# Patient Record
Sex: Female | Born: 1995 | Race: White | Hispanic: No | Smoking: Never smoker
Health system: Southern US, Community
[De-identification: ages and names within clinical notes are randomized; demographics above are authoritative.]

## PROBLEM LIST (undated history)

## (undated) DIAGNOSIS — F419 Anxiety disorder, unspecified: Secondary | ICD-10-CM

## (undated) DIAGNOSIS — G43909 Migraine, unspecified, not intractable, without status migrainosus: Secondary | ICD-10-CM

## (undated) DIAGNOSIS — S82102A Unspecified fracture of upper end of left tibia, initial encounter for closed fracture: Secondary | ICD-10-CM

## (undated) DIAGNOSIS — F99 Mental disorder, not otherwise specified: Secondary | ICD-10-CM

## (undated) DIAGNOSIS — G4700 Insomnia, unspecified: Secondary | ICD-10-CM

## (undated) HISTORY — PX: DENTAL SURGERY: SHX609

## (undated) HISTORY — DX: Mental disorder, not otherwise specified: F99

## (undated) HISTORY — PX: TONSILLECTOMY: SUR1361

---
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
## 2016-01-22 ENCOUNTER — Encounter (HOSPITAL_COMMUNITY): Payer: Self-pay

## 2016-01-22 ENCOUNTER — Emergency Department (HOSPITAL_COMMUNITY)
Admission: EM | Admit: 2016-01-22 | Discharge: 2016-01-23 | Disposition: A | Discharge status: Home / Self Care | Payer: BLUE CROSS/BLUE SHIELD | Attending: Emergency Medicine | Admitting: Emergency Medicine

## 2016-01-22 DIAGNOSIS — N390 Urinary tract infection, site not specified: Secondary | ICD-10-CM | POA: Insufficient documentation

## 2016-01-22 DIAGNOSIS — K920 Hematemesis: Secondary | ICD-10-CM | POA: Diagnosis present

## 2016-01-22 DIAGNOSIS — R109 Unspecified abdominal pain: Secondary | ICD-10-CM

## 2016-01-22 HISTORY — DX: Migraine, unspecified, not intractable, without status migrainosus: G43.909

## 2016-01-22 HISTORY — DX: Anxiety disorder, unspecified: F41.9

## 2016-01-22 HISTORY — DX: Insomnia, unspecified: G47.00

## 2016-01-22 LAB — I-STAT BETA HCG BLOOD, ED (MC, WL, AP ONLY)

## 2016-01-22 LAB — COMPREHENSIVE METABOLIC PANEL
ALK PHOS: 93 U/L (ref 38–126)
ALT: 25 U/L (ref 14–54)
ANION GAP: 10 (ref 5–15)
AST: 28 U/L (ref 15–41)
Albumin: 4.2 g/dL (ref 3.5–5.0)
BILIRUBIN TOTAL: 0.3 mg/dL (ref 0.3–1.2)
BUN: 8 mg/dL (ref 6–20)
CALCIUM: 9.4 mg/dL (ref 8.9–10.3)
CO2: 22 mmol/L (ref 22–32)
Chloride: 101 mmol/L (ref 101–111)
Creatinine, Ser: 0.73 mg/dL (ref 0.44–1.00)
GFR calc Af Amer: >60 mL/min (ref >60)
Glucose, Bld: 87 mg/dL (ref 65–99)
POTASSIUM: 3.2 mmol/L — AB (ref 3.5–5.1)
Sodium: 133 mmol/L — ABNORMAL LOW (ref 135–145)
TOTAL PROTEIN: 8.2 g/dL — AB (ref 6.5–8.1)

## 2016-01-22 LAB — CBC
HEMATOCRIT: 44.2 % (ref 36.0–46.0)
Hemoglobin: 15 g/dL (ref 12.0–15.0)
MCH: 29.9 pg (ref 26.0–34.0)
MCHC: 33.9 g/dL (ref 30.0–36.0)
MCV: 88 fL (ref 78.0–100.0)
Platelets: 325 10*3/uL (ref 150–400)
RBC: 5.02 MIL/uL (ref 3.87–5.11)
RDW: 12.5 % (ref 11.5–15.5)
WBC: 8.1 10*3/uL (ref 4.0–10.5)

## 2016-01-22 LAB — URINE MICROSCOPIC-ADD ON

## 2016-01-22 LAB — URINALYSIS, ROUTINE W REFLEX MICROSCOPIC
BILIRUBIN URINE: NEGATIVE
Glucose, UA: NEGATIVE mg/dL
Ketones, ur: NEGATIVE mg/dL
NITRITE: POSITIVE — AB
PH: 5.5 (ref 5.0–8.0)
Protein, ur: NEGATIVE mg/dL
Specific Gravity, Urine: 1.029 (ref 1.005–1.030)

## 2016-01-22 LAB — LIPASE, BLOOD: Lipase: 21 U/L (ref 11–51)

## 2016-01-22 NOTE — ED Triage Notes (Signed)
Onset 5pm vomiting x 5.  Last few vomits has had dark red blood mixed in vomitus.  Onset last night diarrhea x < 10, brick color now.  No one in household with same symptoms.  C/o nasal congestion.

## 2016-01-23 LAB — POC OCCULT BLOOD, ED: Fecal Occult Bld: NEGATIVE

## 2016-01-23 MED ORDER — CEPHALEXIN 500 MG PO CAPS: 500.0000 mg | ORAL_CAPSULE | Freq: Three times a day (TID) | ORAL | 0 refills | Status: DC

## 2016-01-23 MED ORDER — PANTOPRAZOLE SODIUM 20 MG PO TBEC: 20.0000 mg | DELAYED_RELEASE_TABLET | Freq: Every day | ORAL | 0 refills | Status: DC

## 2016-01-23 MED ORDER — DEXTROSE 5 % IV SOLN
1.0000 g | Freq: Once | INTRAVENOUS | Status: AC
  Administered 2016-01-23: 1 g via INTRAVENOUS
  Filled 2016-01-23: qty 10

## 2016-01-23 MED ORDER — SODIUM CHLORIDE 0.9 % IV BOLUS (SEPSIS)
1000.0000 mL | Freq: Once | INTRAVENOUS | Status: AC
  Administered 2016-01-23: 1000 mL via INTRAVENOUS

## 2016-01-23 MED ORDER — FAMOTIDINE IN NACL 20-0.9 MG/50ML-% IV SOLN
20.0000 mg | Freq: Once | INTRAVENOUS | Status: AC
  Administered 2016-01-23: 20 mg via INTRAVENOUS
  Filled 2016-01-23: qty 50

## 2016-01-23 MED ORDER — ONDANSETRON HCL 4 MG PO TABS: 4.0000 mg | ORAL_TABLET | Freq: Three times a day (TID) | ORAL | 0 refills | Status: DC | PRN

## 2016-01-23 NOTE — Discharge Instructions (Signed)
Read the information below.  Use the prescribed medication as directed.  Please discuss all new medications with your pharmacist.  You may return to the Emergency Department at any time for worsening condition or any new symptoms that concern you.   If you develop high fevers, worsening abdominal pain, uncontrolled vomiting, or are unable to tolerate fluids by mouth, return to the ER for a recheck.  ° °

## 2016-01-23 NOTE — ED Provider Notes (Signed)
[facility]-EMERGENCY DEPT Provider Note   CSN: [id number] Arrival date & time: 01/22/16  2217     History   Chief Complaint No chief complaint on file.   HPI Vanessa Nelson is a 20 y.o. female.  HPI   Pt with hx frequent UTIs p/w abdominal pain, vomiting with hematemesis, brick-colored stool with mucus, malodorous dark urine.  The symptoms began 3 days ago with crampy pain in the periumbilical area, subjective fevers.  Today she developed vomiting that initially had specks of blood in it and as she vomited more and more she developed more blood.  She also noticed that her stool today was brick red in color with some mucus floating with it.  Denies abnormal vaginal discharge or bleeding.  Denies any other abnormal bleeding.   She has never had a kidney stone.  Never had any GI-related disease including PUD, colitis.  Denies use of NSAIDs, aspirin, ETOH.    Past Medical History:  Diagnosis Date  . Anxiety   . Fracture of upper end of left tibia 07/06/2014   fell while skateboarding  . Insomnia   . Migraines     There are no active problems to display for this patient.   Past Surgical History:  Procedure Laterality Date  . CHONDROPLASTY Left 07/09/2014   Procedure: CHONDROPLASTY;  Surgeon: Sheral Apleyimothy D Murphy, MD;  Location: [facility];  Service: Orthopedics;  Laterality: Left;  . DENTAL SURGERY     for impacted teeth  . KNEE ARTHROSCOPY Left 07/09/2014   Procedure: ARTHROSCOPY KNEE;  Surgeon: Sheral Apleyimothy D Murphy, MD;  Location: [facility];  Service: Orthopedics;  Laterality: Left;  . ORIF TIBIA PLATEAU Left 07/09/2014   Procedure: OPEN REDUCTION INTERNAL FIXATION (ORIF) TIBIAL PLATEAU;  Surgeon: Sheral Apleyimothy D Murphy, MD;  Location:  SURGERY CENTER;  Service: Orthopedics;  Laterality: Left;    OB History    No data available       Home Medications    Prior to Admission medications   Medication Sig Start Date End Date Taking? Authorizing Provider    cephALEXin (KEFLEX) 500 MG capsule Take 1 capsule (500 mg total) by mouth 3 (three) times daily. 01/23/16   Trixie DredgeEmily Laiya Wisby, PA-C  docusate sodium (COLACE) 100 MG capsule Take 1 capsule (100 mg total) by mouth 2 (two) times daily. Continue this while taking narcotics to help with bowel movements 07/09/14   Sheral Apleyimothy D Murphy, MD  HYDROcodone-acetaminophen [facility](NORCO) 5-325 MG per tablet Take 1-2 tablets by mouth every 6 (six) hours as needed for moderate pain. MAXIMUM TOTAL ACETAMINOPHEN DOSE IS 4000 MG PER DAY 07/09/14   Teryl LucyJoshua Landau, MD  ondansetron (ZOFRAN) 4 MG tablet Take 1 tablet (4 mg total) by mouth every 8 (eight) hours as needed for nausea or vomiting. 01/23/16   Trixie DredgeEmily Tywana Robotham, PA-C  oxyCODONE-acetaminophen (ROXICET) 5-325 MG per tablet Take 2 tablets by mouth every 4 (four) hours as needed. 07/09/14   Sheral Apleyimothy D Murphy, MD  pantoprazole (PROTONIX) 20 MG tablet Take 1 tablet (20 mg total) by mouth daily. 01/23/16   Trixie DredgeEmily Reign Dziuba, PA-C    Family History History reviewed. No pertinent family history.  Social History Social History  Substance Use Topics  . Smoking status: Never Smoker  . Smokeless tobacco: Never Used  . Alcohol use No     Allergies   Oxycodone; Tamiflu [oseltamivir phosphate]; and Nickel   Review of Systems Review of Systems  All other systems reviewed and are negative.    Physical Exam  Updated Vital Signs BP 119/69   Pulse 83   Temp 97.6 F (36.4 C) (Oral)   Resp 14   Ht 5\' 1"  (1.549 m)   Wt 73.9 kg   LMP 01/08/2016   SpO2 100%   BMI 30.80 kg/m   Physical Exam  Constitutional: She appears well-developed and well-nourished. No distress.  HENT:  Head: Normocephalic and atraumatic.  Neck: Neck supple.  Cardiovascular: Normal rate and regular rhythm.   Pulmonary/Chest: Effort normal and breath sounds normal. No respiratory distress. She has no wheezes. She has no rales.  Abdominal: Soft. She exhibits no distension. There is tenderness in the epigastric area and  suprapubic area. There is CVA tenderness (mild, right ). There is no rebound and no guarding.  Neurological: She is alert.  Skin: She is not diaphoretic.  Nursing note and vitals reviewed.    ED Treatments / Results  Labs (all labs ordered are listed, but only abnormal results are displayed) Labs Reviewed  COMPREHENSIVE METABOLIC PANEL - Abnormal; Notable for the following:       Result Value   Sodium 133 (*)    Potassium 3.2 (*)    Total Protein 8.2 (*)    All other components within normal limits  URINALYSIS, ROUTINE W REFLEX MICROSCOPIC (NOT AT [facility]) - Abnormal; Notable for the following:    APPearance TURBID (*)    Hgb urine dipstick TRACE (*)    Nitrite POSITIVE (*)    Leukocytes, UA SMALL (*)    All other components within normal limits  URINE MICROSCOPIC-ADD ON - Abnormal; Notable for the following:    Squamous Epithelial / LPF 0-5 (*)    Bacteria, UA MANY (*)    Crystals CA OXALATE CRYSTALS (*)    All other components within normal limits  URINE CULTURE  LIPASE, BLOOD  CBC  I-STAT BETA HCG BLOOD, ED ([facility], [facility], [facility] ONLY)  POC OCCULT BLOOD, ED    EKG  EKG Interpretation None       Radiology No results found.  Procedures Procedures (including critical care time)  Medications Ordered in ED Medications  sodium chloride 0.9 % bolus 1,000 mL (0 mLs Intravenous Stopped 01/23/16 0150)  cefTRIAXone (ROCEPHIN) 1 g in dextrose 5 % 50 mL IVPB (0 g Intravenous Stopped 01/23/16 0225)  famotidine (PEPCID) IVPB 20 mg premix (0 mg Intravenous Stopped 01/23/16 0150)     Initial Impression / Assessment and Plan / ED Course  I have reviewed the triage vital signs and the nursing notes.  Pertinent labs & imaging results that were available during my care of the patient were reviewed by me and considered in my medical decision making (see chart for details).  Clinical Course  Comment By Time  Pt not yet in room  [facility], PA-C 08/28 0000   Afebrile, nontoxic patient  with abdominal pain, vomiting, malodorous urine.  Workup remarkable for urine that appears infected.  Hgb is normal.  Hemoccult negative.  Pt given IVF, IV pepcid, IV rocephin.  Urine sent for culture.  Pt monitored in ED with no further vomiting.   D/C home with keflex (extended for possible early pyelo), protonix, zofran.  PCP follow up.  Discussed strict return precautions.  Discussed result, findings, treatment, and follow up  with patient.  Pt given return precautions.  Pt verbalizes understanding and agrees with plan.       Final Clinical Impressions(s) / ED Diagnoses   Final diagnoses:  Abdominal pain, unspecified abdominal location  UTI (lower  urinary tract infection)    New Prescriptions New Prescriptions   CEPHALEXIN (KEFLEX) 500 MG CAPSULE    Take 1 capsule (500 mg total) by mouth 3 (three) times daily.   ONDANSETRON (ZOFRAN) 4 MG TABLET    Take 1 tablet (4 mg total) by mouth every 8 (eight) hours as needed for nausea or vomiting.   PANTOPRAZOLE (PROTONIX) 20 MG TABLET    Take 1 tablet (20 mg total) by mouth daily.     Trixie Dredge, PA-C 01/23/16 0236    Linwood Dibbles, MD 01/24/16 [phone]

## 2016-01-25 LAB — URINE CULTURE: Culture: >=100000 — AB

## 2016-01-26 ENCOUNTER — Telehealth (HOSPITAL_COMMUNITY): Payer: Self-pay

## 2016-01-26 NOTE — Telephone Encounter (Signed)
Post ED Visit - Positive Culture Follow-up  Culture report reviewed by antimicrobial stewardship pharmacist:  []  Enzo BiNathan Batchelder, Pharm.D. []  Celedonio MiyamotoJeremy Frens, Pharm.D., BCPS []  Garvin FilaMike Maccia, Pharm.D. []  Georgina PillionElizabeth Martin, Pharm.D., BCPS []  LakelandMinh Pham, [address] Rainbow BoulevardPharm.D., BCPS, AAHIVP []  Estella HuskMichelle Turner, Pharm.D., BCPS, AAHIVP []  Tennis Mustassie Stewart, Pharm.D. []  Sherle Poeob Vincent, [place].Morrie Sheldon. X  Michael Bitonti, Pharm.D.  Positive urine culture>/= 100,000 colonies -> E Coli Treated with Cephalexin, organism sensitive to the same and no further patient follow-up is required at this time.  Arvid RightClark, [place] Bensinger Dorn 01/26/2016, 12:26 PM

## 2016-09-26 ENCOUNTER — Encounter (HOSPITAL_COMMUNITY): Payer: Self-pay | Admitting: *Deleted

## 2016-09-26 ENCOUNTER — Emergency Department (HOSPITAL_COMMUNITY)
Admission: EM | Admit: 2016-09-26 | Discharge: 2016-09-26 | Disposition: A | Discharge status: Other Acute Inpatient Hospital | Payer: BLUE CROSS/BLUE SHIELD | Attending: Emergency Medicine | Admitting: Emergency Medicine

## 2016-09-26 ENCOUNTER — Observation Stay (HOSPITAL_COMMUNITY)
Admission: AD | Admit: 2016-09-26 | Discharge: 2016-09-27 | Disposition: A | Discharge status: Home / Self Care | Payer: BLUE CROSS/BLUE SHIELD | Source: Intra-hospital | Attending: Psychiatry | Admitting: Psychiatry

## 2016-09-26 DIAGNOSIS — F332 Major depressive disorder, recurrent severe without psychotic features: Secondary | ICD-10-CM | POA: Insufficient documentation

## 2016-09-26 DIAGNOSIS — Z79899 Other long term (current) drug therapy: Secondary | ICD-10-CM | POA: Insufficient documentation

## 2016-09-26 DIAGNOSIS — F329 Major depressive disorder, single episode, unspecified: Secondary | ICD-10-CM | POA: Diagnosis present

## 2016-09-26 DIAGNOSIS — Z793 Long term (current) use of hormonal contraceptives: Secondary | ICD-10-CM | POA: Diagnosis not present

## 2016-09-26 DIAGNOSIS — R45851 Suicidal ideations: Secondary | ICD-10-CM | POA: Insufficient documentation

## 2016-09-26 DIAGNOSIS — F1290 Cannabis use, unspecified, uncomplicated: Secondary | ICD-10-CM | POA: Diagnosis not present

## 2016-09-26 DIAGNOSIS — F339 Major depressive disorder, recurrent, unspecified: Secondary | ICD-10-CM

## 2016-09-26 DIAGNOSIS — F419 Anxiety disorder, unspecified: Secondary | ICD-10-CM | POA: Insufficient documentation

## 2016-09-26 LAB — CBC WITH DIFFERENTIAL/PLATELET
Basophils Absolute: 0 10*3/uL (ref 0.0–0.1)
Basophils Relative: 0 %
EOS ABS: 0 10*3/uL (ref 0.0–0.7)
Eosinophils Relative: 0 %
HCT: 43 % (ref 36.0–46.0)
Hemoglobin: 14.6 g/dL (ref 12.0–15.0)
Lymphocytes Relative: 20 %
Lymphs Abs: 1.6 10*3/uL (ref 0.7–4.0)
MCH: 30.2 pg (ref 26.0–34.0)
MCHC: 34 g/dL (ref 30.0–36.0)
MCV: 89 fL (ref 78.0–100.0)
MONO ABS: 0.4 10*3/uL (ref 0.1–1.0)
MONOS PCT: 5 %
Neutro Abs: 6.1 10*3/uL (ref 1.7–7.7)
Neutrophils Relative %: 75 %
Platelets: 311 10*3/uL (ref 150–400)
RBC: 4.83 MIL/uL (ref 3.87–5.11)
RDW: 12.4 % (ref 11.5–15.5)
WBC: 8 10*3/uL (ref 4.0–10.5)

## 2016-09-26 LAB — COMPREHENSIVE METABOLIC PANEL
ALBUMIN: 4.5 g/dL (ref 3.5–5.0)
ALK PHOS: 80 U/L (ref 38–126)
ALT: 17 U/L (ref 14–54)
ANION GAP: 8 (ref 5–15)
AST: 20 U/L (ref 15–41)
BUN: 10 mg/dL (ref 6–20)
CALCIUM: 9.3 mg/dL (ref 8.9–10.3)
CO2: 24 mmol/L (ref 22–32)
Chloride: 105 mmol/L (ref 101–111)
Creatinine, Ser: 0.76 mg/dL (ref 0.44–1.00)
GFR calc non Af Amer: >60 mL/min (ref >60)
GLUCOSE: 89 mg/dL (ref 65–99)
POTASSIUM: 4.2 mmol/L (ref 3.5–5.1)
SODIUM: 137 mmol/L (ref 135–145)
Total Bilirubin: 0.6 mg/dL (ref 0.3–1.2)
Total Protein: 8.3 g/dL — ABNORMAL HIGH (ref 6.5–8.1)

## 2016-09-26 LAB — I-STAT BETA HCG BLOOD, ED (MC, WL, AP ONLY): I-stat hCG, quantitative: <5 m[IU]/mL (ref <5)

## 2016-09-26 LAB — RAPID URINE DRUG SCREEN, HOSP PERFORMED
Amphetamines: NOT DETECTED
BENZODIAZEPINES: NOT DETECTED
Barbiturates: NOT DETECTED
Cocaine: NOT DETECTED
OPIATES: NOT DETECTED
Tetrahydrocannabinol: POSITIVE — AB

## 2016-09-26 LAB — ACETAMINOPHEN LEVEL: Acetaminophen (Tylenol), Serum: <10 ug/mL — ABNORMAL LOW (ref 10–30)

## 2016-09-26 LAB — ETHANOL: Alcohol, Ethyl (B): <5 mg/dL (ref <5)

## 2016-09-26 LAB — SALICYLATE LEVEL

## 2016-09-26 MED ORDER — TRAZODONE HCL 50 MG PO TABS
50.0000 mg | ORAL_TABLET | Freq: Every evening | ORAL | Status: DC | PRN
  Administered 2016-09-26: 50 mg via ORAL
  Filled 2016-09-26: qty 1

## 2016-09-26 MED ORDER — IBUPROFEN 200 MG PO TABS
600.0000 mg | ORAL_TABLET | Freq: Three times a day (TID) | ORAL | Status: DC | PRN
  Administered 2016-09-26: 600 mg via ORAL
  Filled 2016-09-26: qty 3

## 2016-09-26 MED ORDER — ONDANSETRON HCL 4 MG PO TABS: 4.0000 mg | ORAL_TABLET | Freq: Three times a day (TID) | ORAL | Status: DC | PRN

## 2016-09-26 MED ORDER — NORETHIN ACE-ETH ESTRAD-FE 1-20 MG-MCG PO TABS: 1.0000 | ORAL_TABLET | Freq: Every day | ORAL | Status: DC

## 2016-09-26 MED ORDER — ACETAMINOPHEN 325 MG PO TABS
650.0000 mg | ORAL_TABLET | ORAL | Status: DC | PRN
Start: 2016-09-26 — End: 2016-09-26
  Administered 2016-09-26: 650 mg via ORAL
  Filled 2016-09-26: qty 2

## 2016-09-26 MED ORDER — HYDROXYZINE HCL 25 MG PO TABS: 25.0000 mg | ORAL_TABLET | Freq: Four times a day (QID) | ORAL | Status: DC | PRN

## 2016-09-26 MED ORDER — ALUM & MAG HYDROXIDE-SIMETH 200-200-20 MG/5ML PO SUSP: 30.0000 mL | ORAL | Status: DC | PRN

## 2016-09-26 MED ORDER — ONDANSETRON HCL 4 MG PO TABS
4.0000 mg | ORAL_TABLET | Freq: Three times a day (TID) | ORAL | Status: DC | PRN
  Administered 2016-09-26: 4 mg via ORAL
  Filled 2016-09-26: qty 1

## 2016-09-26 MED ORDER — LAMOTRIGINE 25 MG PO TABS
75.0000 mg | ORAL_TABLET | Freq: Every day | ORAL | Status: DC
  Administered 2016-09-26: 75 mg via ORAL
  Filled 2016-09-26: qty 3

## 2016-09-26 MED ORDER — ACETAMINOPHEN 325 MG PO TABS: 650.0000 mg | ORAL_TABLET | ORAL | Status: DC | PRN

## 2016-09-26 MED ORDER — HOME MED STORE IN PYXIS: 1.0000 | Freq: Every day | Status: DC

## 2016-09-26 MED ORDER — IBUPROFEN 600 MG PO TABS: 600.0000 mg | ORAL_TABLET | Freq: Three times a day (TID) | ORAL | Status: DC | PRN

## 2016-09-26 MED ORDER — LORAZEPAM 1 MG PO TABS
1.0000 mg | ORAL_TABLET | Freq: Three times a day (TID) | ORAL | Status: DC | PRN
  Administered 2016-09-26: 1 mg via ORAL
  Filled 2016-09-26: qty 1

## 2016-09-26 MED ORDER — LAMOTRIGINE 25 MG PO TABS: 75.0000 mg | ORAL_TABLET | Freq: Every day | ORAL | Status: DC

## 2016-09-26 MED ORDER — ZOLPIDEM TARTRATE 5 MG PO TABS: 5.0000 mg | ORAL_TABLET | Freq: Every evening | ORAL | Status: DC | PRN
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
## 2016-09-26 NOTE — ED Triage Notes (Signed)
Pt reports suicidal thought and reports her meds for bipolar disorder are not working. Denies plan for si nor Hi . Pt in tears and sts she needs help . Also reports HX anxiety attacks and no meds. Alert and oriented x 4.

## 2016-09-26 NOTE — ED Notes (Signed)
Bed: WA31 Expected date:  Expected time:  Means of arrival:  Comments: 

## 2016-09-26 NOTE — ED Provider Notes (Signed)
[facility]-EMERGENCY DEPT Provider Note   CSN: [id number] Arrival date & time: 09/26/16  1250     History   Chief Complaint No chief complaint on file.   HPI Vanessa Nelson is a 21 y.o. female with a PMHx of migraines, bipolar disorder, anxiety, and chronic pain, who presents to the ED with complaints of worsening depression over the last several months and suicidal ideations 1 day. Patient states that she was started on a course of antidepressants in August 2016 that was "all wrong", she then switched psychiatrists and is now seeing Dr. Catha Gosselin at [facility] in [place], and he started her on Lamictal. She was initially taking 50 mg every night, but in March he increased her dose to 75 mg a night. She takes this as directed, is compliant last taking it at 11 PM last night. She feels that this isn't helping, and last night she began having suicidal ideations without a specific plan. She denies HI/AVH, illicit drug use, alcohol use, or smoking tobacco. She denies any other medical complaints at this time. She is here voluntarily. Her mother is in the lobby all of her belongings. Of note, the only other medication she takes is birth control, which her mother brought with her.   The history is provided by the patient and medical records. No language interpreter was used.  Mental Health Problem  Presenting symptoms: depression and suicidal thoughts   Presenting symptoms: no hallucinations and no homicidal ideas   Patient accompanied by:  Parent Onset quality:  Gradual Duration:  1 day (SI x1 day, but depression worsening for months) Timing:  Constant Progression:  Worsening Chronicity:  Chronic Context: recent medication change   Context: not alcohol use and not drug abuse   Treatment compliance:  All of the time Time since last psychoactive medication taken:  13 hours Relieved by:  None tried Worsened by:  Nothing Ineffective treatments:  None tried Associated symptoms: no abdominal pain  and no chest pain   Risk factors: hx of mental illness     Past Medical History:  Diagnosis Date  . Anxiety   . Fracture of upper end of left tibia 07/06/2014   fell while skateboarding  . Insomnia   . Migraines     There are no active problems to display for this patient.   Past Surgical History:  Procedure Laterality Date  . CHONDROPLASTY Left 07/09/2014   Procedure: CHONDROPLASTY;  Surgeon: Sheral Apley, MD;  Location: [facility];  Service: Orthopedics;  Laterality: Left;  . DENTAL SURGERY     for impacted teeth  . KNEE ARTHROSCOPY Left 07/09/2014   Procedure: ARTHROSCOPY KNEE;  Surgeon: Sheral Apley, MD;  Location: [facility];  Service: Orthopedics;  Laterality: Left;  . ORIF TIBIA PLATEAU Left 07/09/2014   Procedure: OPEN REDUCTION INTERNAL FIXATION (ORIF) TIBIAL PLATEAU;  Surgeon: Sheral Apley, MD;  Location:  SURGERY CENTER;  Service: Orthopedics;  Laterality: Left;    OB History    No data available       Home Medications    Prior to Admission medications   Medication Sig Start Date End Date Taking? Authorizing Provider  cephALEXin (KEFLEX) 500 MG capsule Take 1 capsule (500 mg total) by mouth 3 (three) times daily. 01/23/16   Trixie Dredge, PA-C  docusate sodium (COLACE) 100 MG capsule Take 1 capsule (100 mg total) by mouth 2 (two) times daily. Continue this while taking narcotics to help with bowel movements 07/09/14  Sheral Apley, MD  HYDROcodone-acetaminophen (NORCO) 5-325 MG per tablet Take 1-2 tablets by mouth every 6 (six) hours as needed for moderate pain. MAXIMUM TOTAL ACETAMINOPHEN DOSE IS 4000 MG PER DAY 07/09/14   Teryl Lucy, MD  ondansetron (ZOFRAN) 4 MG tablet Take 1 tablet (4 mg total) by mouth every 8 (eight) hours as needed for nausea or vomiting. 01/23/16   Trixie Dredge, PA-C  oxyCODONE-acetaminophen (ROXICET) 5-325 MG per tablet Take 2 tablets by mouth every 4 (four) hours as needed. 07/09/14   Sheral Apley, MD  pantoprazole (PROTONIX) 20 MG tablet Take 1 tablet (20 mg total) by mouth daily. 01/23/16   Trixie Dredge, PA-C    Family History No family history on file.  Social History Social History  Substance Use Topics  . Smoking status: Never Smoker  . Smokeless tobacco: Never Used  . Alcohol use No     Allergies   Oxycodone; Tamiflu [oseltamivir phosphate]; and Nickel   Review of Systems Review of Systems  Constitutional: Negative for chills and fever.  Respiratory: Negative for shortness of breath.   Cardiovascular: Negative for chest pain.  Gastrointestinal: Negative for abdominal pain, constipation, diarrhea, nausea and vomiting.  Genitourinary: Negative for dysuria and hematuria.  Musculoskeletal: Negative for arthralgias and myalgias.  Skin: Negative for color change.  Allergic/Immunologic: Negative for immunocompromised state.  Neurological: Negative for weakness and numbness.  Psychiatric/Behavioral: Positive for suicidal ideas. Negative for confusion, hallucinations and homicidal ideas.   All other systems reviewed and are negative for acute change except as noted in the HPI.    Physical Exam Updated Vital Signs BP 129/78 (BP Location: Left Arm)   Pulse 80   Temp 98.1 F (36.7 C) (Oral)   Resp 16   SpO2 100%   Physical Exam  Constitutional: She is oriented to person, place, and time. Vital signs are normal. She appears well-developed and well-nourished.  Non-toxic appearance. No distress.  Afebrile, nontoxic, NAD, tearful but pleasant and cooperative  HENT:  Head: Normocephalic and atraumatic.  Mouth/Throat: Oropharynx is clear and moist and mucous membranes are normal.  Eyes: Conjunctivae and EOM are normal. Right eye exhibits no discharge. Left eye exhibits no discharge.  Neck: Normal range of motion. Neck supple.  Cardiovascular: Normal rate, regular rhythm, normal heart sounds and intact distal pulses.  Exam reveals no gallop and no friction rub.   No  murmur heard. Pulmonary/Chest: Effort normal and breath sounds normal. No respiratory distress. She has no decreased breath sounds. She has no wheezes. She has no rhonchi. She has no rales.  Abdominal: Soft. Normal appearance and bowel sounds are normal. She exhibits no distension. There is no tenderness. There is no rigidity, no rebound, no guarding, no CVA tenderness, no tenderness at McBurney's point and negative Murphy's sign.  Musculoskeletal: Normal range of motion.  Neurological: She is alert and oriented to person, place, and time. She has normal strength. No sensory deficit.  Skin: Skin is warm, dry and intact. No rash noted.  Psychiatric: She is not actively hallucinating. She exhibits a depressed mood. She expresses suicidal ideation. She expresses no homicidal ideation. She expresses no suicidal plans and no homicidal plans.  Depressed affect, tearful at times, but pleasant and cooperative. Endorsing SI without a plan, denies HI or AVH, doesn't seem to be responding to internal stimuli.  Nursing note and vitals reviewed.    ED Treatments / Results  Labs (all labs ordered are listed, but only abnormal results are displayed) Labs Reviewed  COMPREHENSIVE METABOLIC PANEL - Abnormal; Notable for the following:       Result Value   Total Protein 8.3 (*)    All other components within normal limits  ACETAMINOPHEN LEVEL - Abnormal; Notable for the following:    Acetaminophen (Tylenol), Serum <10 (*)    All other components within normal limits  RAPID URINE DRUG SCREEN, HOSP PERFORMED - Abnormal; Notable for the following:    Tetrahydrocannabinol POSITIVE (*)    All other components within normal limits  CBC WITH DIFFERENTIAL/PLATELET  ETHANOL  SALICYLATE LEVEL  I-STAT BETA HCG BLOOD, ED ([facility], [facility], [facility] ONLY)    EKG  EKG Interpretation None       Radiology No results found.  Procedures Procedures (including critical care time)  Medications Ordered in ED Medications  alum  & mag hydroxide-simeth (MAALOX/MYLANTA) 200-200-20 MG/5ML suspension 30 mL (not administered)  ondansetron (ZOFRAN) tablet 4 mg (not administered)  zolpidem (AMBIEN) tablet 5 mg (not administered)  ibuprofen (ADVIL,MOTRIN) tablet 600 mg (not administered)  acetaminophen (TYLENOL) tablet 650 mg (not administered)  LORazepam (ATIVAN) tablet 1 mg (not administered)  lamoTRIgine (LAMICTAL) tablet 75 mg (not administered)  norethindrone-ethinyl estradiol (JUNEL FE,GILDESS FE,LOESTRIN FE) 1-20 MG-MCG per tablet 1 tablet (not administered)     Initial Impression / Assessment and Plan / ED Course  I have reviewed the triage vital signs and the nursing notes.  Pertinent labs & imaging results that were available during my care of the patient were reviewed by me and considered in my medical decision making (see chart for details).     21 y.o. female here with SI x1 day, worsening depression over the last several months. Recently increased lamictal dose and still doesn't feel like it's helping. No plan for SI, denies HI/AVH, no illicit drugs/EtOH use. Nonsmoker. No other complaints. On exam, tearful and depressed affect. Will get psych clearance labs and reassess after. Home meds ordered, of note she'll have to take her home OCP from the outpatient rx she has with her (mom in lobby has them), ordered for her to have this kept with [place] for her use here. Will reassess shortly.   3:17 PM EtOH undetectable. CBC w/diff WNL. CMP WNL. Salicylate and acetaminophen levels WNL. HCG negative. UDS with +THC but otherwise negative. Pt medically cleared at this time. Psych hold orders and home med orders placed. Please see TTS notes for further documentation of care/dispo. PLEASE NOTE THAT PT IS HERE VOLUNTARILY AT THIS TIME, IF PT TRIES TO LEAVE THEY WOULD NEED IVC PAPERWORK TAKEN OUT. Pt stable at time of med clearance.     Final Clinical Impressions(s) / ED Diagnoses   Final diagnoses:  Suicidal ideation    Episode of recurrent major depressive disorder, unspecified depression episode severity (HCC)  Marijuana use    New Prescriptions New Prescriptions   No medications on file     [address] PA-C 09/26/16 1518    Tilden Fossa, MD 09/27/16 1555

## 2016-09-26 NOTE — BHH Counselor (Signed)
  Per Renata Caprice, NP, patient meets criteria for the [facility] OBS unit (bed #6). Support paperwork completed. Patient may transfer to the OBS unit after shift change. Nursing report [phone].

## 2016-09-26 NOTE — ED Notes (Signed)
Spoke with pt, informed a transfer to behavior health will be taking place with the next hour or two. Report needs to be called and transport to be set up.

## 2016-09-26 NOTE — ED Notes (Signed)
Called report to observation unit bed 2 assignment  Spoke with nurse, [phone]  Calling [facility] for transport otw.  Paper work complete
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
## 2016-09-27 DIAGNOSIS — F332 Major depressive disorder, recurrent severe without psychotic features: Secondary | ICD-10-CM | POA: Diagnosis not present

## 2016-09-27 MED ORDER — RISPERIDONE 2 MG PO TABS: 2.0000 mg | ORAL_TABLET | Freq: Every day | ORAL | 0 refills | Status: DC

## 2016-09-27 MED ORDER — RISPERIDONE 2 MG PO TABS: 2.0000 mg | ORAL_TABLET | Freq: Every day | ORAL | Status: DC

## 2016-09-27 MED ORDER — SERTRALINE HCL 50 MG PO TABS: 50.0000 mg | ORAL_TABLET | Freq: Every day | ORAL | 0 refills | Status: DC

## 2016-09-27 MED ORDER — SERTRALINE HCL 50 MG PO TABS
50.0000 mg | ORAL_TABLET | Freq: Every day | ORAL | Status: DC
  Administered 2016-09-27: 50 mg via ORAL
  Filled 2016-09-27: qty 1

## 2016-09-27 MED ORDER — HYDROXYZINE HCL 25 MG PO TABS: 25.0000 mg | ORAL_TABLET | Freq: Four times a day (QID) | ORAL | 0 refills | Status: DC | PRN

## 2016-09-27 NOTE — BHH Counselor (Signed)
This Clinical research associatewriter spoke with pt in regards to her admission here and was informed that she is looking to have her medications adjusted. Pt shared that her former provider prescribed her medications and did not follow up with her when she voiced concerns about the way it was making her feel. Pt shared that she would like to have another provider, along with a therapist. This Clinical research associatewriter [facility]. Pt was receptive to this information. This Clinical research associatewriter will call and schedule a follow appointment for pt. Pt currently denies SI/HI/AVH.
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
## 2016-09-27 NOTE — Progress Notes (Signed)
Patient resting in bed, in scrubs. Affect anxious with congruent mood. States her issue is ineffective medications. States she has not felt former providers were listening as she described symptoms and lack of efficacy. Reports lamictal has made her feel more irritable with decreased sleep and appetite. "I only eat one meal a day." Denies weight loss. States she does not have any thoughts of suicide. No HI/AVH.  Offered emotional support, oriented to observation unit process and what she may expect. Patient's OCP ordered however not available.  Patient verbalizes understanding. Remains safe, currently watching TV.

## 2016-09-27 NOTE — Progress Notes (Signed)
Patient verbalizes readiness for discharge. Follow up plan explained, AVS given along with prescriptions. All belongings returned. Patient verbalizes understanding. Denies SI/HI and assures this Clinical research associatewriter she will seek assistance should that change. Patient discharged ambulatory and in stable condition to mother.

## 2016-09-27 NOTE — BHH Counselor (Signed)
This Clinical research associatewriter scheduled this patient a follow up appointment with Dr. Kathryne SharperSyed Arfeen @ [address], [place], [place] on Thursday, Oct 18, 2016 @8 :00am.

## 2016-09-27 NOTE — Discharge Summary (Signed)
[facility] Discharge Summary Note  Patient:  Vanessa Nelson is an 21 y.o., female MRN:  [id number] DOB:  [birth year] Patient phone:  [phone] (home)  Patient address:   [id number] [address] [place] [address],  Total Time spent with patient: 30 minutes  Date of Admission:  09/26/2016 Date of Discharge: 09/27/2016  Reason for Admission:  Increased depression and medication adjustment  Principal Problem: MDD (major depressive disorder), recurrent severe, without psychosis [facility]) Discharge Diagnoses: Patient Active Problem List   Diagnosis Date Noted  . MDD (major depressive disorder), recurrent severe, without psychosis (HCC) [F33.2] 09/26/2016    Priority: High    Past Psychiatric History: MDD  Past Medical History:  Past Medical History:  Diagnosis Date  . Anxiety   . Fracture of upper end of left tibia 07/06/2014   fell while skateboarding  . Insomnia   . Migraines     Past Surgical History:  Procedure Laterality Date  . CHONDROPLASTY Left 07/09/2014   Procedure: CHONDROPLASTY;  Surgeon: Sheral Apley, MD;  Location: [facility];  Service: Orthopedics;  Laterality: Left;  . DENTAL SURGERY     for impacted teeth  . KNEE ARTHROSCOPY Left 07/09/2014   Procedure: ARTHROSCOPY KNEE;  Surgeon: Sheral Apley, MD;  Location: [facility];  Service: Orthopedics;  Laterality: Left;  . ORIF TIBIA PLATEAU Left 07/09/2014   Procedure: OPEN REDUCTION INTERNAL FIXATION (ORIF) TIBIAL PLATEAU;  Surgeon: Sheral Apley, MD;  Location: [facility];  Service: Orthopedics;  Laterality: Left;   Family History: History reviewed. No pertinent family history. Family Psychiatric  History: Unknown Social History:  History  Alcohol Use No     History  Drug Use No    Social History   Social History  . Marital status: Single    Spouse name: N/A  . Number of children: N/A  . Years of education: N/A   Social History Main Topics  . Smoking  status: Never Smoker  . Smokeless tobacco: Never Used  . Alcohol use No  . Drug use: No  . Sexual activity: Not Asked   Other Topics Concern  . None   Social History Narrative  . None    Hospital Course:  Vanessa Nelson is a 21 year old female who presented to the [facility] with increased anxiety and depression. Pt spent the night in the OBS unit without incident. Pt stated her current medications are not working for her and she could not even talk yesterday without crying and that is why she went to the hospital. New medications were prescribed and Pt will be discharged on Zoloft 50 mg qd and Risperdal 2 mg QHS. Pt stated that Thorazine makes her vomit. Pt is open to attending outpatient therapy at [facility] and has an appointment with Dr Lolly Mustache on Thursday May 24th at 8:00 AM. Pt is stable for discharge.   Physical Findings: AIMS:  , ,  ,  ,    CIWA:    COWS:     Musculoskeletal: Strength & Muscle Tone: within normal limits Gait & Station: normal Patient leans: N/A  Psychiatric Specialty Exam: Physical Exam  Constitutional: She is oriented to person, place, and time. She appears well-developed and well-nourished.  HENT:  Head: Normocephalic.  Right Ear: External ear normal.  Left Ear: External ear normal.  Neck: Normal range of motion.  Musculoskeletal: Normal range of motion.  Neurological: She is alert and oriented to person, place, and time.  Review of Systems  Psychiatric/Behavioral: Positive for depression. Negative for hallucinations, memory loss, substance abuse and suicidal ideas. The patient is not nervous/anxious and does not have insomnia.   All other systems reviewed and are negative.   Blood pressure 124/67, pulse 95, temperature 98.4 F (36.9 C), temperature source Oral, resp. rate 16, height 5\' 1"  (1.549 m), weight 74.4 kg (164 lb), SpO2 100 %.Body mass index is 30.99 kg/m.  General Appearance: Casual  Eye Contact:  Good  Speech:  Clear and Coherent and Normal Rate   Volume:  Normal  Mood:  Depressed  Affect:  Congruent and Depressed  Thought Process:  Coherent, Goal Directed and Linear  Orientation:  Full (Time, Place, and Person)  Thought Content:  Logical  Suicidal Thoughts:  No  Homicidal Thoughts:  No  Memory:  Immediate;   Good Recent;   Good Remote;   Fair  Judgement:  Good  Insight:  Good  Psychomotor Activity:  Normal  Concentration:  Concentration: Good and Attention Span: Good  Recall:  Good  Fund of Knowledge:  Good  Language:  Good  Akathisia:  No  Handed:  Right  AIMS (if indicated):     Assets:  Communication Skills Desire for Improvement Financial Resources/Insurance Housing Leisure Time Physical Health Resilience Social Support Transportation Vocational/Educational  ADL's:  Intact  Cognition:  WNL  Sleep:   Poor        Has this patient used any form of tobacco in the last 30 days? (Cigarettes, Smokeless Tobacco, Cigars, and/or Pipes) Yes, No  Blood Alcohol level:  Lab Results  Component Value Date   ETH <5 09/26/2016    Metabolic Disorder Labs:  No results found for: HGBA1C, MPG No results found for: PROLACTIN No results found for: CHOL, TRIG, HDL, CHOLHDL, VLDL, LDLCALC  See Psychiatric Specialty Exam and Suicide Risk Assessment completed by Attending Physician prior to discharge.  Discharge destination:  Home  Is patient on multiple antipsychotic therapies at discharge:  No   Has Patient had three or more failed trials of antipsychotic monotherapy by history:  No  Recommended Plan for Multiple Antipsychotic Therapies: NA   Allergies as of 09/27/2016      Reactions   Oxycodone Nausea And Vomiting   Tamiflu [oseltamivir Phosphate] Nausea And Vomiting, Rash   Nickel Rash      Medication List    STOP taking these medications   Ibuprofen-Famotidine 800-26.6 MG Tabs   lamoTRIgine 25 MG tablet Commonly known as:  LAMICTAL     TAKE these medications     Indication  hydrOXYzine 25 MG  tablet Commonly known as:  ATARAX/VISTARIL Take 1 tablet (25 mg total) by mouth every 6 (six) hours as needed for anxiety.  Indication:  Anxiety Neurosis   JUNEL FE 1/20 1-20 MG-MCG tablet Generic drug:  norethindrone-ethinyl estradiol Take 1 tablet by mouth daily.  Indication:  Dysfunctional Bleeding From the Uterus   risperiDONE 2 MG tablet Commonly known as:  RISPERDAL Take 1 tablet (2 mg total) by mouth at bedtime.  Indication:  Manic-Depression   sertraline 50 MG tablet Commonly known as:  ZOLOFT Take 1 tablet (50 mg total) by mouth daily. Start taking on:  09/28/2016  Indication:  Major Depressive Disorder      Follow-up Information    ?  Dr. Kathi Ludwig T. Arfeen. Go on 10/18/2016.  Why: Follow up appointment for medication management  Contact information:  [facility]  [address] [place].  [address]  [place], [place]  [phone]  ?  Please arrive promptly at 8:00 am for this appointment  ?  ?  ?      Follow-up recommendations:  Activity:  as tolerated Diet:  Regular  Follow up with Dr Lolly MustacheArfeen on May 24th at 8:00 AM Take all medications as prescribed Stay well hydrated Follow up with your PCP for any new or existing medical concerns  Comments:  Pt is stable for discharge home.   Signed: Laveda AbbeLaurie Britton Parks, NP 09/27/2016, 12:02 PM

## 2016-10-04 ENCOUNTER — Encounter (HOSPITAL_COMMUNITY): Payer: Self-pay | Admitting: Psychiatry

## 2016-10-04 ENCOUNTER — Ambulatory Visit (INDEPENDENT_AMBULATORY_CARE_PROVIDER_SITE_OTHER): Payer: BLUE CROSS/BLUE SHIELD | Admitting: Psychiatry

## 2016-10-04 DIAGNOSIS — F3131 Bipolar disorder, current episode depressed, mild: Secondary | ICD-10-CM | POA: Diagnosis not present

## 2016-10-04 DIAGNOSIS — Z818 Family history of other mental and behavioral disorders: Secondary | ICD-10-CM | POA: Diagnosis not present

## 2016-10-04 DIAGNOSIS — Z79899 Other long term (current) drug therapy: Secondary | ICD-10-CM

## 2016-10-04 MED ORDER — LAMOTRIGINE 25 MG PO TABS: ORAL_TABLET | ORAL | 0 refills | Status: DC
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
## 2016-10-18 ENCOUNTER — Ambulatory Visit (HOSPITAL_COMMUNITY): Payer: Self-pay | Admitting: Psychiatry

## 2016-10-18 ENCOUNTER — Ambulatory Visit (INDEPENDENT_AMBULATORY_CARE_PROVIDER_SITE_OTHER): Payer: BLUE CROSS/BLUE SHIELD | Admitting: Psychiatry

## 2016-10-18 ENCOUNTER — Encounter (HOSPITAL_COMMUNITY): Payer: Self-pay | Admitting: Psychiatry

## 2016-10-18 VITALS — BP 98/62 | HR 77 | Ht 61.0 in | Wt 162.0 lb

## 2016-10-18 DIAGNOSIS — F3131 Bipolar disorder, current episode depressed, mild: Secondary | ICD-10-CM

## 2016-10-18 MED ORDER — BUPROPION HCL 75 MG PO TABS: 75.0000 mg | ORAL_TABLET | Freq: Every day | ORAL | 0 refills | Status: DC

## 2016-10-18 MED ORDER — LAMOTRIGINE 100 MG PO TABS: 100.0000 mg | ORAL_TABLET | Freq: Every day | ORAL | 0 refills | Status: DC
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
## 2016-11-08 ENCOUNTER — Encounter (HOSPITAL_COMMUNITY): Payer: Self-pay | Admitting: Psychiatry

## 2016-11-08 ENCOUNTER — Ambulatory Visit (INDEPENDENT_AMBULATORY_CARE_PROVIDER_SITE_OTHER): Payer: BLUE CROSS/BLUE SHIELD | Admitting: Psychiatry

## 2016-11-08 VITALS — BP 118/72 | HR 90 | Ht 61.0 in | Wt 161.1 lb

## 2016-11-08 DIAGNOSIS — F319 Bipolar disorder, unspecified: Secondary | ICD-10-CM | POA: Diagnosis not present

## 2016-11-08 DIAGNOSIS — F4310 Post-traumatic stress disorder, unspecified: Secondary | ICD-10-CM

## 2016-11-08 DIAGNOSIS — Z885 Allergy status to narcotic agent status: Secondary | ICD-10-CM

## 2016-11-08 DIAGNOSIS — Z79899 Other long term (current) drug therapy: Secondary | ICD-10-CM

## 2016-11-08 DIAGNOSIS — Z888 Allergy status to other drugs, medicaments and biological substances status: Secondary | ICD-10-CM | POA: Diagnosis not present

## 2016-11-08 DIAGNOSIS — F3131 Bipolar disorder, current episode depressed, mild: Secondary | ICD-10-CM

## 2016-11-08 MED ORDER — LAMOTRIGINE 150 MG PO TABS: 150.0000 mg | ORAL_TABLET | Freq: Every day | ORAL | 1 refills | Status: DC

## 2016-11-08 MED ORDER — AMITRIPTYLINE HCL 25 MG PO TABS: 25.0000 mg | ORAL_TABLET | Freq: Every day | ORAL | 1 refills | Status: DC

## 2016-11-08 NOTE — Progress Notes (Signed)
BH MD/PA/NP OP Progress Note  11/08/2016 8:37 AM Vanessa Nelson  MRN:  [id number]  Chief Complaint:  Chief Complaint    Follow-up     Subjective:  I don't like Wellbutrin.  I'm unable to sleep.  I'm not able to eat.  HPI: Vanessa Nelson came for her follow-up appointment.  She was last seen 3 weeks ago and we added low-dose Wellbutrin because patient was complaining of depression.  We also increase Lamictal to 100 mg.  She has noticed her irritability anger and manic symptoms are much better but she has insomnia, racing thoughts, loss of appetite.  She believe her Wellbutrin causing these symptoms.  She's also in the process of switching her job.  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  She wants to do part-time job because she does not want to take more distress.  She is hoping to start school at [employer] in August.  She is pleased that her transcripts are transferred from [employer].  She lives by herself.  Her parents live in [place] and recently she visited them she only stayed 2 days because there are arguments and yelling back and forth.  She still feels sometimes sad depressed isolated withdrawn and lack of energy and motivation to do things.  However she denies any suicidal thoughts or homicidal thoughts.  She has no paranoia or any hallucination.  She is not drinking or using any illegal substances.  Her appetite is low.  Her energy level is low.  Her vital signs are stable.  Patient denies any nightmares or any flashback.  Patient will start counseling with Adella Hare on July 3.  Visit Diagnosis:    ICD-10-CM   1. Bipolar affective disorder, currently depressed, mild (HCC) F31.31 lamoTRIgine (LAMICTAL) 150 MG tablet    amitriptyline (ELAVIL) 25 MG tablet    Past Psychiatric History: Reviewed. Patient reported history of mood swing, mania, impulsive behavior , depression since her teens.  She was seeing PAA at [facility] and given Prozac but it makes her  depression worse.  She saw psychiatrist in [place] and given Lamictal but it was discontinued when [facility] due to severe depression and having suicidal thoughts.  She was tried Risperdal and Zoloft by Dr. Ladona Ridgel in this office but she felt miserable.  We tried low-dose Wellbutrin 75 mg but it causes insomnia , nausea and she could not tolerate.  She also remember taking amitriptyline and hydroxyzine for migraine headaches.  Patient denies any history of suicidal attempt or any history of psychosis.  Past Medical History:  Past Medical History:  Diagnosis Date  . Anxiety   . Fracture of upper end of left tibia 07/06/2014   fell while skateboarding  . Insomnia   . Migraines     Past Surgical History:  Procedure Laterality Date  . CHONDROPLASTY Left 07/09/2014   Procedure: CHONDROPLASTY;  Surgeon: Sheral Apley, MD;  Location: [facility];  Service: Orthopedics;  Laterality: Left;  . DENTAL SURGERY     for impacted teeth  . KNEE ARTHROSCOPY Left 07/09/2014   Procedure: ARTHROSCOPY KNEE;  Surgeon: Sheral Apley, MD;  Location: [facility];  Service: Orthopedics;  Laterality: Left;  . ORIF TIBIA PLATEAU Left 07/09/2014   Procedure: OPEN REDUCTION INTERNAL FIXATION (ORIF) TIBIAL PLATEAU;  Surgeon: Sheral Apley, MD;  Location: [facility];  Service: Orthopedics;  Laterality: Left;  Family Psychiatric History: Reviewed.  Family History: History reviewed. No pertinent family history.  Social History:  Social History   Social History  . Marital status: Single    Spouse name: N/A  . Number of children: N/A  . Years of education: N/A   Social History Main Topics  . Smoking status: Never Smoker  . Smokeless tobacco: Never Used  . Alcohol use No  . Drug use: No  . Sexual activity: Not Asked   Other Topics Concern  . None   Social History Narrative  . None    Allergies:   Allergies  Allergen Reactions  . Oxycodone Nausea And Vomiting  . Tamiflu [Oseltamivir Phosphate] Nausea And Vomiting and Rash  . Nickel Rash    Metabolic Disorder Labs: No results found for: HGBA1C, MPG No results found for: PROLACTIN No results found for: CHOL, TRIG, HDL, CHOLHDL, VLDL, LDLCALC   Current Medications: Current Outpatient Prescriptions  Medication Sig Dispense Refill  . lamoTRIgine (LAMICTAL) 150 MG tablet Take 1 tablet (150 mg total) by mouth daily. 30 tablet 1  . norethindrone-ethinyl estradiol (JUNEL FE 1/20) 1-20 MG-MCG tablet Take 1 tablet by mouth daily.    Marland Kitchen. amitriptyline (ELAVIL) 25 MG tablet Take 1 tablet (25 mg total) by mouth at bedtime. 30 tablet 1   No current facility-administered medications for this visit.     Neurologic: Headache: Yes Seizure: No Paresthesias: No  Musculoskeletal: Strength & Muscle Tone: within normal limits Gait & Station: normal Patient leans: N/A  Psychiatric Specialty Exam: Review of Systems  Constitutional: Negative.   HENT: Negative.   Eyes: Negative.   Respiratory: Negative.   Cardiovascular: Negative.   Gastrointestinal: Positive for nausea.  Genitourinary: Negative.   Musculoskeletal: Negative.   Skin: Negative.  Negative for itching and rash.  Neurological: Positive for headaches. Negative for tremors.  Psychiatric/Behavioral: Positive for depression. The patient is nervous/anxious and has insomnia.     Blood pressure 118/72, pulse 90, height 5\' 1"  (1.549 m), weight 161 lb 1.6 oz (73.1 kg).Body mass index is 30.44 kg/m.  General Appearance: Casual  Eye Contact:  Good  Speech:  Clear and Coherent  Volume:  Normal  Mood:  Anxious and Depressed  Affect:  Labile  Thought Process:  Goal Directed  Orientation:  Full (Time, Place, and Person)  Thought Content: WDL and Logical   Suicidal Thoughts:  No  Homicidal Thoughts:  No  Memory:  Immediate;   Good Recent;   Good Remote;   Good  Judgement:  Good   Insight:  Good  Psychomotor Activity:  Normal  Concentration:  Concentration: Good and Attention Span: Good  Recall:  Good  Fund of Knowledge: Good  Language: Good  Akathisia:  No  Handed:  Right  AIMS (if indicated):  0  Assets:  Communication Skills Desire for Improvement Housing Resilience Talents/Skills Transportation Vocational/Educational  ADL's:  Intact  Cognition: WNL  Sleep:  Insomnia     Assessment: Bipolar disorder type I.  Posttraumatic stress disorder.  Plan: I review her symptoms, current medication and psychosocial stressors.  I will discontinue Wellbutrin 75 mg which is causing insomnia and lack of appetite.  In the past she had tried amitriptyline for the headache and she tolerated the medicine very well.  I will start amitriptyline 25 mg at bedtime and increase Lamictal 150 mg daily to help the mood lability.  Patient is scheduled to see Glena NorfolkLee Ann Yates on July 3 for coping skills.  Patient is in process of switching  her job and also she will start school at [employer].  Reassurance given.  Patient does not have any rash, itching, tremors or shakes.  Discuss safety concern that anytime having active suicidal thoughts or homicidal thoughts and she need to call 911 or go to the local emergency room.  Follow-up in 2 months.  Time spent 25 minutes.  More than 50% of the time spent in psychoeducation, counseling and coordination of care.  Discuss safety plan that anytime having active suicidal thoughts or homicidal thoughts then patient need to call 911 or go to the local emergency room.    Dorma Altman T., MD 11/08/2016, 8:37 AM

## 2016-11-16 ENCOUNTER — Encounter (HOSPITAL_BASED_OUTPATIENT_CLINIC_OR_DEPARTMENT_OTHER): Payer: Self-pay | Admitting: Emergency Medicine

## 2016-11-16 ENCOUNTER — Emergency Department (HOSPITAL_BASED_OUTPATIENT_CLINIC_OR_DEPARTMENT_OTHER): Payer: BLUE CROSS/BLUE SHIELD

## 2016-11-16 ENCOUNTER — Emergency Department (HOSPITAL_BASED_OUTPATIENT_CLINIC_OR_DEPARTMENT_OTHER)
Admission: EM | Admit: 2016-11-16 | Discharge: 2016-11-16 | Disposition: A | Discharge status: Home / Self Care | Payer: BLUE CROSS/BLUE SHIELD | Attending: Emergency Medicine | Admitting: Emergency Medicine

## 2016-11-16 DIAGNOSIS — N3000 Acute cystitis without hematuria: Secondary | ICD-10-CM | POA: Insufficient documentation

## 2016-11-16 DIAGNOSIS — Z79899 Other long term (current) drug therapy: Secondary | ICD-10-CM | POA: Diagnosis not present

## 2016-11-16 DIAGNOSIS — R1031 Right lower quadrant pain: Secondary | ICD-10-CM | POA: Diagnosis present

## 2016-11-16 LAB — CBC WITH DIFFERENTIAL/PLATELET
BASOS ABS: 0 10*3/uL (ref 0.0–0.1)
BASOS PCT: 0 %
EOS ABS: 0.1 10*3/uL (ref 0.0–0.7)
EOS PCT: 1 %
HCT: 41.6 % (ref 36.0–46.0)
Hemoglobin: 14.6 g/dL (ref 12.0–15.0)
Lymphocytes Relative: 25 %
Lymphs Abs: 1.9 10*3/uL (ref 0.7–4.0)
MCH: 30.9 pg (ref 26.0–34.0)
MCHC: 35.1 g/dL (ref 30.0–36.0)
MCV: 88.1 fL (ref 78.0–100.0)
MONO ABS: 0.5 10*3/uL (ref 0.1–1.0)
MONOS PCT: 7 %
Neutro Abs: 5.1 10*3/uL (ref 1.7–7.7)
Neutrophils Relative %: 67 %
PLATELETS: 318 10*3/uL (ref 150–400)
RBC: 4.72 MIL/uL (ref 3.87–5.11)
RDW: 13.1 % (ref 11.5–15.5)
WBC: 7.5 10*3/uL (ref 4.0–10.5)

## 2016-11-16 LAB — URINALYSIS, ROUTINE W REFLEX MICROSCOPIC
BILIRUBIN URINE: NEGATIVE
GLUCOSE, UA: NEGATIVE mg/dL
HGB URINE DIPSTICK: NEGATIVE
KETONES UR: NEGATIVE mg/dL
Nitrite: NEGATIVE
PROTEIN: NEGATIVE mg/dL
Specific Gravity, Urine: 1.025 (ref 1.005–1.030)
pH: 7.5 (ref 5.0–8.0)

## 2016-11-16 LAB — I-STAT CHEM 8, ED
BUN: 11 mg/dL (ref 6–20)
CREATININE: 0.7 mg/dL (ref 0.44–1.00)
Calcium, Ion: 1.15 mmol/L (ref 1.15–1.40)
Chloride: 101 mmol/L (ref 101–111)
Glucose, Bld: 83 mg/dL (ref 65–99)
HEMATOCRIT: 42 % (ref 36.0–46.0)
HEMOGLOBIN: 14.3 g/dL (ref 12.0–15.0)
POTASSIUM: 4 mmol/L (ref 3.5–5.1)
Sodium: 139 mmol/L (ref 135–145)
TCO2: 27 mmol/L (ref 0–100)

## 2016-11-16 LAB — URINALYSIS, MICROSCOPIC (REFLEX): RBC / HPF: NONE SEEN RBC/hpf (ref 0–5)

## 2016-11-16 LAB — LIPASE, BLOOD: LIPASE: 21 U/L (ref 11–51)

## 2016-11-16 LAB — PREGNANCY, URINE: PREG TEST UR: NEGATIVE

## 2016-11-16 IMAGING — CT CT ABD-PELV W/ CM
2 of 4 series · 16 of 46 positions shown, 18 images · IV contrast (APPLIED)
Comparison: None.

ADDENDUM:
Sentence in the text should read: Gallbladder wall does not appear
appreciably thickened.
CLINICAL DATA: Right lower quadrant pain with nausea and vomiting.
Fever.

EXAM:
CT ABDOMEN AND PELVIS WITH CONTRAST
TECHNIQUE: Multidetector CT imaging of the abdomen and pelvis was performed
using the standard protocol following bolus administration of
intravenous contrast. Oral contrast was also administered.
CONTRAST:  100mL [AC] IOPAMIDOL ([AC]) INJECTION 61%

[Series 2: axial st · axial · 0.71mm/px · z∈[-463,-43]mm · 13 of 92 slices shown, 15 images]
[im 4/92  soft-tissue]
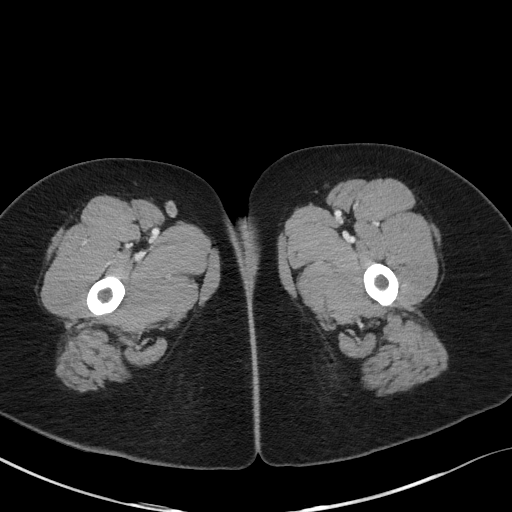
[im 4/92  bone]
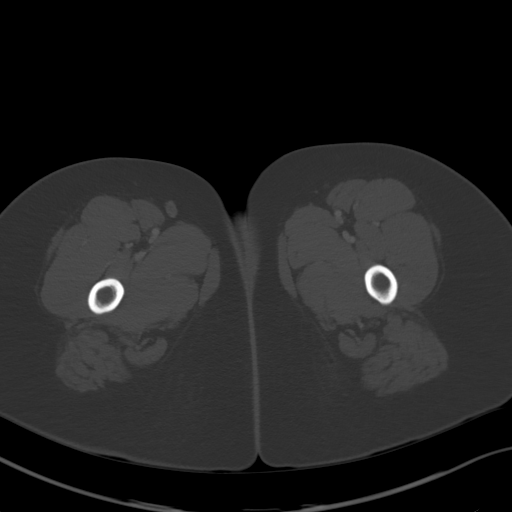
[im 12/92  soft-tissue]
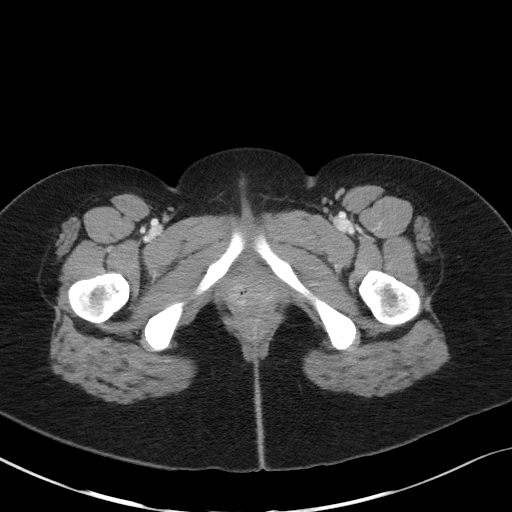
[im 19/92  soft-tissue]
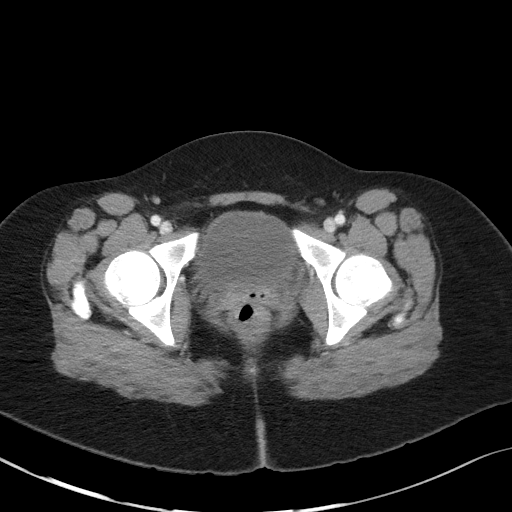
[im 27/92  soft-tissue]
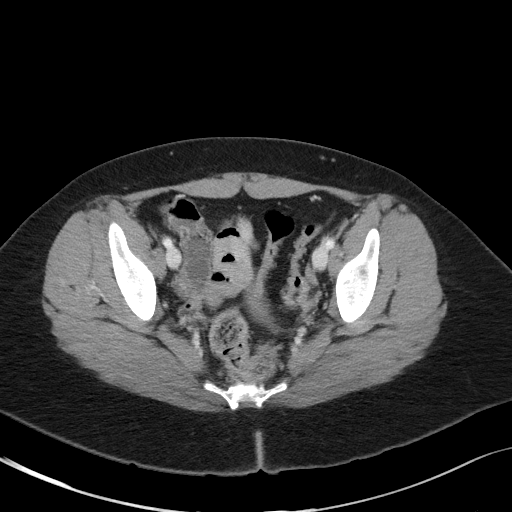
[im 31/92  soft-tissue]
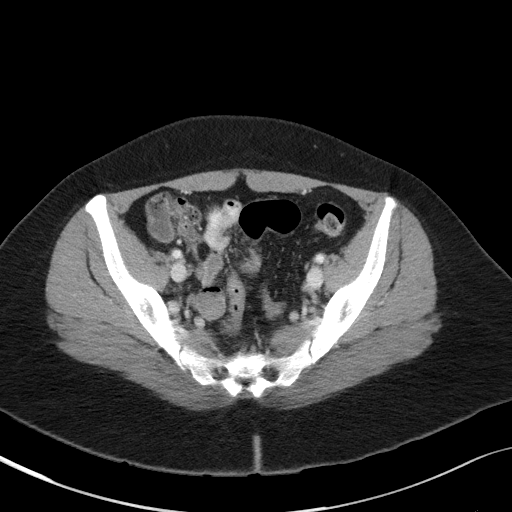
[im 38/92  soft-tissue]
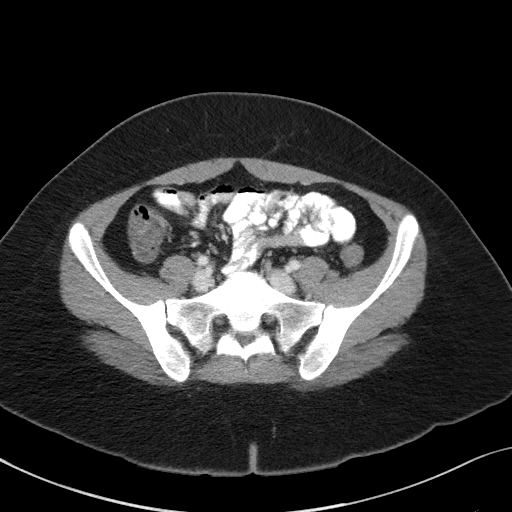
[im 46/92  soft-tissue]
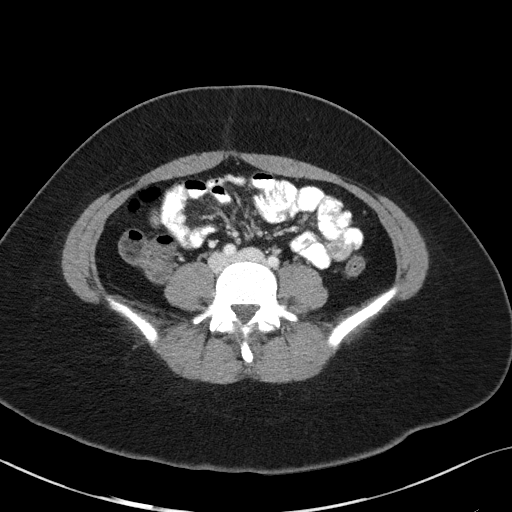
[im 54/92  soft-tissue]
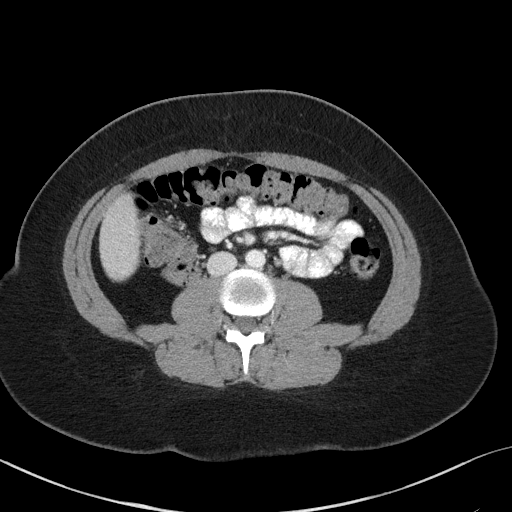
[im 61/92  soft-tissue]
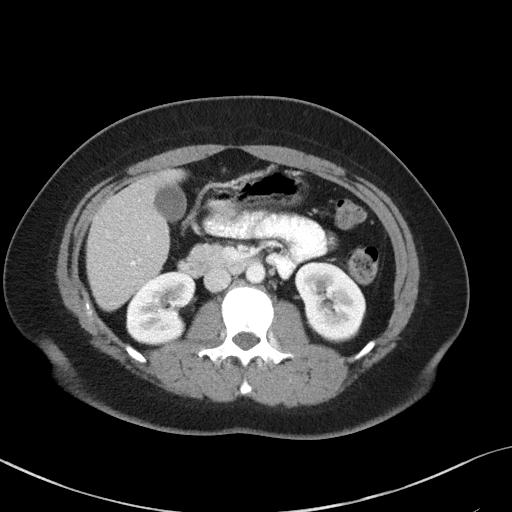
[im 61/92  bone]
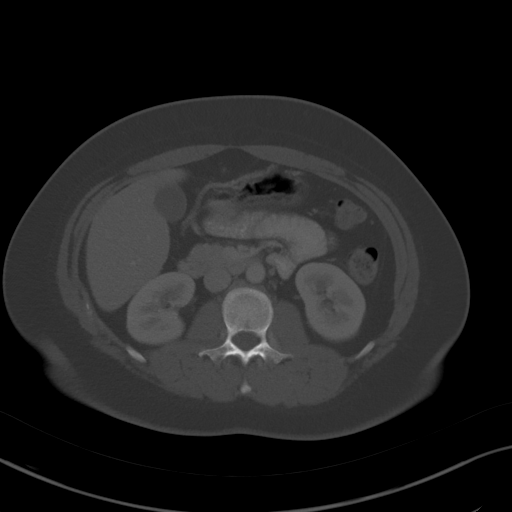
[im 65/92  soft-tissue]
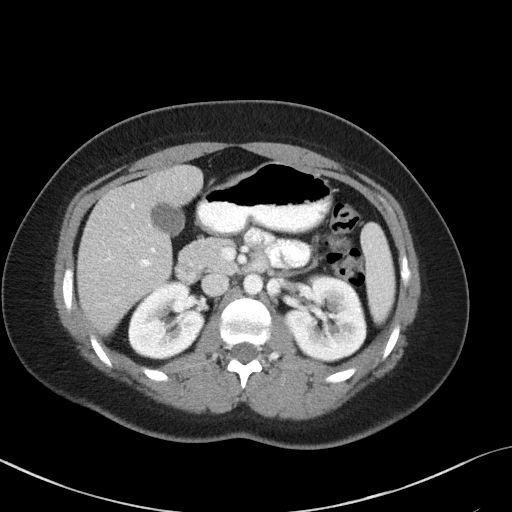
[im 73/92  soft-tissue]
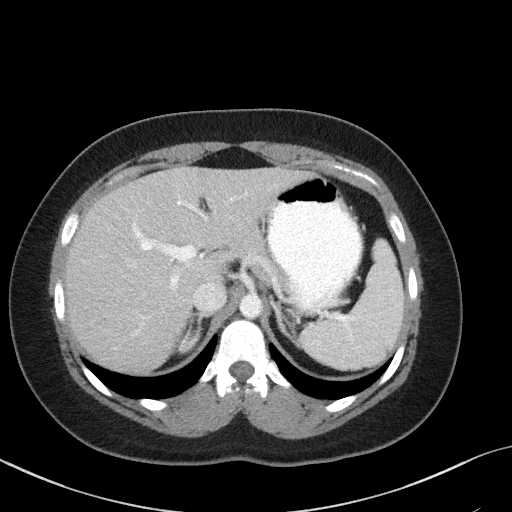
[im 80/92  soft-tissue]
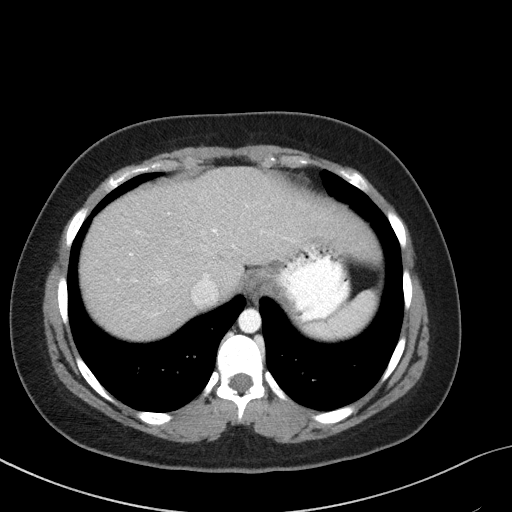
[im 88/92  soft-tissue]
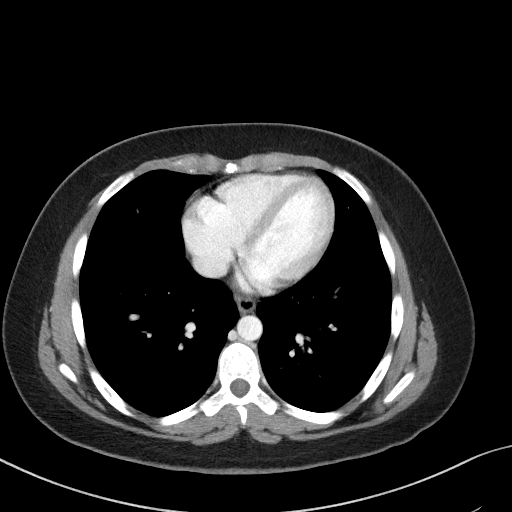

[Series 5: coronal st · coronal · 0.82mm/px · 3 of 79 slices shown]
[im 27/79  soft-tissue]
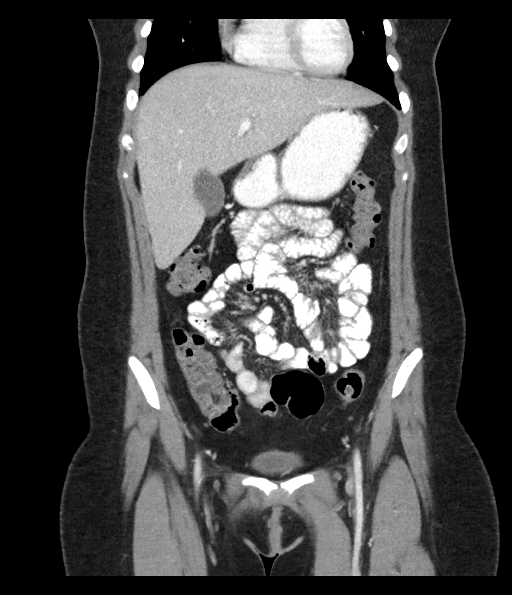
[im 35/79  soft-tissue]
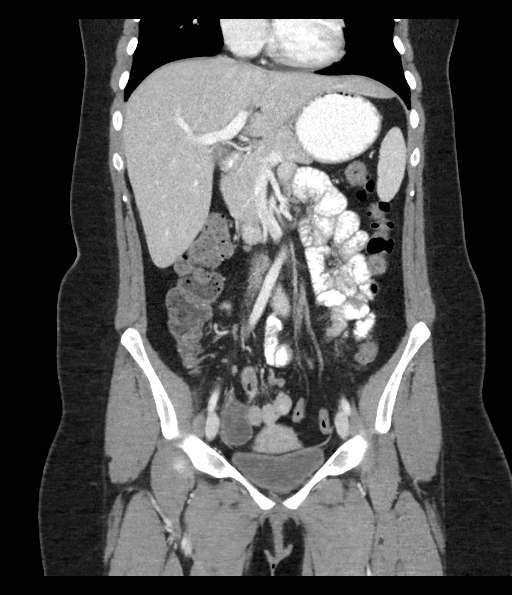
[im 44/79  soft-tissue]
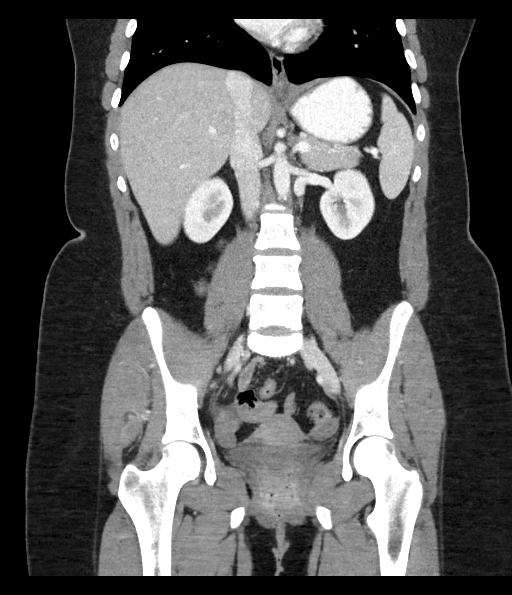

[16 of 46 positions shown; findings below may reference images not displayed]

FINDINGS: Lower chest: Lung bases are clear.

Hepatobiliary: No focal liver lesions are apparent. There is mild
fatty infiltration near the fissure for the ligamentum teres.
Gallbladder wall does appreciably thickened. There is no biliary
duct dilatation.

Pancreas: There is no appreciable pancreatic mass or inflammatory
focus.

Spleen: No splenic lesions are evident.

Adrenals/Urinary Tract: Adrenals appear normal bilaterally. Kidneys
bilaterally show no evident mass or hydronephrosis. There is no
appreciable renal or ureteral calculus on either side. Urinary
bladder is midline with wall thickness within normal limits.

Stomach/Bowel: There is no bowel wall or mesenteric thickening.
There is no evident bowel obstruction. No free air or portal venous
air.

Vascular/Lymphatic: There is no abdominal aortic aneurysm. No
vascular lesions are evident. There is no appreciable adenopathy the
in the abdomen or pelvis.

Reproductive: Uterus is anteverted. There is no evident pelvic mass.

Other: Appendix appears unremarkable. There is no abscess or ascites
in the abdomen or pelvis. There is a small ventral hernia containing
only fat.

Musculoskeletal: There are no blastic or lytic bone lesions. There
is no intramuscular or abdominal wall lesion.
IMPRESSION: A cause for patient's symptoms has not been established with this
study.

Appendix appears normal. There is no bowel wall thickening or
mesenteric thickening. No bowel obstruction. No abscess.

No renal or ureteral calculus.  No hydronephrosis.

There is a small ventral hernia containing only fat.

## 2016-11-16 MED ORDER — SODIUM CHLORIDE 0.9 % IV BOLUS (SEPSIS)
1000.0000 mL | Freq: Once | INTRAVENOUS | Status: AC
Start: 2016-11-16 — End: 2016-11-16
  Administered 2016-11-16: 1000 mL via INTRAVENOUS

## 2016-11-16 MED ORDER — ONDANSETRON HCL 4 MG/2ML IJ SOLN
4.0000 mg | Freq: Once | INTRAMUSCULAR | Status: AC
  Administered 2016-11-16: 4 mg via INTRAVENOUS
  Filled 2016-11-16: qty 2

## 2016-11-16 MED ORDER — IOPAMIDOL (ISOVUE-300) INJECTION 61%
100.0000 mL | Freq: Once | INTRAVENOUS | Status: AC | PRN
  Administered 2016-11-16: 100 mL via INTRAVENOUS

## 2016-11-16 MED ORDER — CEPHALEXIN 250 MG PO CAPS
500.0000 mg | ORAL_CAPSULE | Freq: Once | ORAL | Status: AC
  Administered 2016-11-16: 500 mg via ORAL
  Filled 2016-11-16: qty 2

## 2016-11-16 MED ORDER — CEPHALEXIN 500 MG PO CAPS: 500.0000 mg | ORAL_CAPSULE | Freq: Three times a day (TID) | ORAL | 0 refills | Status: DC

## 2016-11-16 MED ORDER — ONDANSETRON 4 MG PO TBDP: 4.0000 mg | ORAL_TABLET | Freq: Three times a day (TID) | ORAL | 0 refills | Status: DC | PRN

## 2016-11-16 MED FILL — ONDANSETRON ODT 4 MG TABLET: 4 | 2 days supply | Qty: 6 | Fill #0

## 2016-11-16 MED FILL — CEPHALEXIN 500 MG CAPSULE: 500 | 7 days supply | Qty: 21 | Fill #0

## 2016-11-16 NOTE — ED Provider Notes (Signed)
[facility] Provider Note   CSN: [id number] Arrival date & time: 11/16/16  4098     History   Chief Complaint Chief Complaint  Patient presents with  . Abdominal Pain    HPI Vanessa Nelson is a 21 y.o. female.  Patient with no past abdominal surgical history presents with complaint of vomiting and right lower quadrant pain starting yesterday. Patient was seen at an outside urgent care and sent to the emergency department for further evaluation. Patient states that she has not been able to eat or drink over the past 24 hours due to vomiting. She had one large solid stool without blood last night. Pain is worse in the right lower quadrant and radiates to the right upper quadrant. Worse with movement and standing up from a sitting position. No back pain. No dysuria, hematuria, increased frequency. No vaginal bleeding or discharge reported. No chest pain or shortness of breath. No treatments PTA. The onset of this condition was acute. The course is constant. Aggravating factors: none. Alleviating factors: none.   Patient has a history of bipolar disorder and was recently restarted on amitriptyline. She had been on bupropion which causes decreased appetite and difficulty sleeping since this was discontinued.       Past Medical History:  Diagnosis Date  . Anxiety   . Fracture of upper end of left tibia 07/06/2014   fell while skateboarding  . Insomnia   . Migraines     Patient Active Problem List   Diagnosis Date Noted  . Bipolar 1 disorder, depressed, mild (HCC) 10/04/2016    Class: Chronic  . 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 CHONDROPLASTY Left 07/09/2014   Procedure: CHONDROPLASTY;  Surgeon: Sheral Apley, MD;  Location: [facility];  Service: Orthopedics;  Laterality: Left;  . DENTAL SURGERY     for impacted teeth  . KNEE ARTHROSCOPY Left  07/09/2014   Procedure: ARTHROSCOPY KNEE;  Surgeon: Sheral Apley, MD;  Location: [facility];  Service: Orthopedics;  Laterality: Left;  . ORIF TIBIA PLATEAU Left 07/09/2014   Procedure: OPEN REDUCTION INTERNAL FIXATION (ORIF) TIBIAL PLATEAU;  Surgeon: Sheral Apley, MD;  Location: [facility];  Service: Orthopedics;  Laterality: Left;    OB History    No data available       Home Medications    Prior to Admission medications   Medication Sig Start Date End Date Taking? Authorizing Provider  amitriptyline (ELAVIL) 25 MG tablet Take 1 tablet (25 mg total) by mouth at bedtime. 11/08/16   Arfeen, Phillips Grout, MD  lamoTRIgine (LAMICTAL) 150 MG tablet Take 1 tablet (150 mg total) by mouth daily. 11/08/16   Arfeen, Phillips Grout, MD  norethindrone-ethinyl estradiol (JUNEL FE 1/20) 1-20 MG-MCG tablet Take 1 tablet by mouth daily. 09/24/16   [provider]    Family History No family history on file.  Social History Social History  Substance Use Topics  . Smoking status: Never Smoker  . Smokeless tobacco: Never Used  . Alcohol use No     Allergies   Oxycodone; Tamiflu [oseltamivir phosphate]; Wellbutrin [bupropion]; and Nickel   Review of Systems Review of Systems  Constitutional: Positive for appetite change (Decreased). Negative for fever.  HENT: Negative for rhinorrhea and sore throat.   Eyes: Negative for redness.  Respiratory: Negative for cough.   Cardiovascular: Negative for chest pain.  Gastrointestinal:  Positive for abdominal pain, nausea and vomiting. Negative for constipation and diarrhea.  Genitourinary: Negative for dysuria, vaginal bleeding and vaginal discharge.  Musculoskeletal: Negative for myalgias.  Skin: Negative for rash.  Neurological: Negative for headaches.     Physical Exam Updated Vital Signs BP 133/78 (BP Location: Left Arm)   Pulse 80   Temp 98.7 F (37.1 C) (Oral)   Resp 18   Ht 5\' 2"  (1.575 m)   Wt 73.4 kg  (161 lb 12.8 oz)   LMP 11/12/2016   SpO2 100%   BMI 29.59 kg/m   Physical Exam  Constitutional: She appears well-developed and well-nourished.  HENT:  Head: Normocephalic and atraumatic.  Eyes: Conjunctivae are normal. Right eye exhibits no discharge. Left eye exhibits no discharge.  Neck: Normal range of motion. Neck supple.  Cardiovascular: Normal rate, regular rhythm and normal heart sounds.   Pulmonary/Chest: Effort normal and breath sounds normal. No respiratory distress.  Abdominal: Soft. There is tenderness in the right upper quadrant and right lower quadrant. There is tenderness at McBurney's point. There is no rebound and negative Murphy's sign.  + Rovsing, +psoas, +obturator -- all mild  Neurological: She is alert.  Skin: Skin is warm and dry.  Psychiatric: She has a normal mood and affect.  Nursing note and vitals reviewed.    ED Treatments / Results  Labs (all labs ordered are listed, but only abnormal results are displayed) Labs Reviewed  URINALYSIS, ROUTINE W REFLEX MICROSCOPIC - Abnormal; Notable for the following:       Result Value   APPearance CLOUDY (*)    Leukocytes, UA MODERATE (*)    All other components within normal limits  URINALYSIS, MICROSCOPIC (REFLEX) - Abnormal; Notable for the following:    Bacteria, UA MANY (*)    Squamous Epithelial / LPF 6-30 (*)    All other components within normal limits  PREGNANCY, URINE  CBC WITH DIFFERENTIAL/PLATELET  LIPASE, BLOOD  I-STAT CHEM 8, ED    Radiology Ct Abdomen Pelvis W Contrast  Addendum Date: 11/16/2016   ADDENDUM REPORT: 11/16/2016 11:38 ADDENDUM: Sentence in the text should read: Gallbladder wall does not appear appreciably thickened. Electronically Signed   By: Bretta BangWilliam  Woodruff III M.D.   On: 11/16/2016 11:38   Result Date: 11/16/2016 CLINICAL DATA:  Right lower quadrant pain with nausea and vomiting. Fever. EXAM: CT ABDOMEN AND PELVIS WITH CONTRAST TECHNIQUE: Multidetector CT imaging of the  abdomen and pelvis was performed using the standard protocol following bolus administration of intravenous contrast. Oral contrast was also administered. CONTRAST:  100mL ISOVUE-300 IOPAMIDOL (ISOVUE-300) INJECTION 61% COMPARISON:  None. FINDINGS: Lower chest: Lung bases are clear. Hepatobiliary: No focal liver lesions are apparent. There is mild fatty infiltration near the fissure for the ligamentum teres. Gallbladder wall does appreciably thickened. There is no biliary duct dilatation. Pancreas: There is no appreciable pancreatic mass or inflammatory focus. Spleen: No splenic lesions are evident. Adrenals/Urinary Tract: Adrenals appear normal bilaterally. Kidneys bilaterally show no evident mass or hydronephrosis. There is no appreciable renal or ureteral calculus on either side. Urinary bladder is midline with wall thickness within normal limits. Stomach/Bowel: There is no bowel wall or mesenteric thickening. There is no evident bowel obstruction. No free air or portal venous air. Vascular/Lymphatic: There is no abdominal aortic aneurysm. No vascular lesions are evident. There is no appreciable adenopathy the in the abdomen or pelvis. Reproductive: Uterus is anteverted. There is no evident pelvic mass. Other: Appendix appears unremarkable. There is no abscess or  ascites in the abdomen or pelvis. There is a small ventral hernia containing only fat. Musculoskeletal: There are no blastic or lytic bone lesions. There is no intramuscular or abdominal wall lesion. IMPRESSION: A cause for patient's symptoms has not been established with this study. Appendix appears normal. There is no bowel wall thickening or mesenteric thickening. No bowel obstruction. No abscess. No renal or ureteral calculus.  No hydronephrosis. There is a small ventral hernia containing only fat. Electronically Signed: By: Bretta Bang III M.D. On: 11/16/2016 11:23    Procedures Procedures (including critical care time)  Medications  Ordered in ED Medications  sodium chloride 0.9 % bolus 1,000 mL (0 mLs Intravenous Stopped 11/16/16 1216)  ondansetron (ZOFRAN) injection 4 mg (4 mg Intravenous Given 11/16/16 1038)  iopamidol (ISOVUE-300) 61 % injection 100 mL (100 mLs Intravenous Contrast Given 11/16/16 1105)  cephALEXin (KEFLEX) capsule 500 mg (500 mg Oral Given 11/16/16 1217)     Initial Impression / Assessment and Plan / ED Course  I have reviewed the triage vital signs and the nursing notes.  Pertinent labs & imaging results that were available during my care of the patient were reviewed by me and considered in my medical decision making (see chart for details).     Patient seen and examined. Work-up initiated. Medications ordered.   Vital signs reviewed and are as follows: BP 133/78 (BP Location: Left Arm)   Pulse 80   Temp 98.7 F (37.1 C) (Oral)   Resp 18   Ht 5\' 2"  (1.575 m)   Wt 73.4 kg (161 lb 12.8 oz)   LMP 11/12/2016   SpO2 100%   BMI 29.59 kg/m   1:07 PM CT negative. Patient informed.  Delay in obtaining lipase due to needing to be sent out. Chemistry machines were down admits that her [place] for a time. For this reason, i-STAT Chem-8 was ordered instead of CMP. Patient did not have hepatic function panel checked. She does not have focal right upper quadrant tenderness to make me concerned for hepatitis. Do not feel that these need to be drawn today.  Will treat probable urinary tract infection and discharged to home with Zofran.  The patient was urged to return to the Emergency Department immediately with worsening of current symptoms, worsening abdominal pain, persistent vomiting, blood noted in stools, fever, or any other concerns. The patient verbalized understanding.    Final Clinical Impressions(s) / ED Diagnoses   Final diagnoses:  Right lower quadrant abdominal pain  Acute cystitis without hematuria   Patient with RLQ abdominal pain. Vitals are stable, no fever. Labs Reassuring.  Imaging negative. No signs of dehydration, patient is tolerating PO's. Lungs are clear and no signs suggestive of PNA. Low concern for appendicitis, cholecystitis, pancreatitis, ruptured viscus, UTI, kidney stone, aortic dissection, aortic aneurysm or other emergent abdominal etiology. Supportive therapy indicated with return if symptoms worsen.    New Prescriptions New Prescriptions   CEPHALEXIN (KEFLEX) 500 MG CAPSULE    Take 1 capsule (500 mg total) by mouth 3 (three) times daily.   ONDANSETRON (ZOFRAN ODT) 4 MG DISINTEGRATING TABLET    Take 1 tablet (4 mg total) by mouth every 8 (eight) hours as needed for nausea or vomiting.     Renne Crigler, PA-C 11/16/16 1309    Vanetta Mulders, MD 11/23/16 [phone]

## 2016-11-16 NOTE — ED Notes (Signed)
Pt ambulatory unassisted, in NAD. Refused wheelchair.

## 2016-11-16 NOTE — ED Triage Notes (Signed)
Pt c/o RLQ, worse last pm, and started vomiting last pm; was referred here from an UC to r/o appendicitis.

## 2016-11-16 NOTE — Discharge Instructions (Signed)
Please read and follow all provided instructions.  Your diagnoses today include:  1. Right lower quadrant abdominal pain   2. Acute cystitis without hematuria     Tests performed today include:  Blood counts and electrolytes  Blood tests to check liver and kidney function  Blood tests to check pancreas function  Urine test to look for infection and pregnancy (in women)  CT scan - no serious problems or definite cause of your pain  Vital signs. See below for your results today.   Medications prescribed:   Keflex (cephalexin) - antibiotic  You have been prescribed an antibiotic medicine: take the entire course of medicine even if you are feeling better. Stopping early can cause the antibiotic not to work.   Zofran (ondansetron) - for nausea and vomiting  Take any prescribed medications only as directed.  Home care instructions:   Follow any educational materials contained in this packet.  Follow-up instructions: Please follow-up with your primary care provider in the next 3 days for further evaluation of your symptoms.    Return instructions:  SEEK IMMEDIATE MEDICAL ATTENTION IF:  The pain does not go away or becomes severe   A temperature above 101F develops   Repeated vomiting occurs (multiple episodes)   The pain becomes localized to portions of the abdomen. The right side could possibly be appendicitis. In an adult, the left lower portion of the abdomen could be colitis or diverticulitis.   Blood is being passed in stools or vomit (bright red or black tarry stools)   You develop chest pain, difficulty breathing, dizziness or fainting, or become confused, poorly responsive, or inconsolable (young children)  If you have any other emergent concerns regarding your health  Additional Information: Abdominal (belly) pain can be caused by many things. Your caregiver performed an examination and possibly ordered blood/urine tests and imaging (CT scan, x-rays,  ultrasound). Many cases can be observed and treated at home after initial evaluation in the emergency department. Even though you are being discharged home, abdominal pain can be unpredictable. Therefore, you need a repeated exam if your pain does not resolve, returns, or worsens. Most patients with abdominal pain don't have to be admitted to the hospital or have surgery, but serious problems like appendicitis and gallbladder attacks can start out as nonspecific pain. Many abdominal conditions cannot be diagnosed in one visit, so follow-up evaluations are very important.  Your vital signs today were: BP 110/66    Pulse 82    Temp 98.7 F (37.1 C) (Oral)    Resp 16    Ht 5\' 2"  (1.575 m)    Wt 73.4 kg (161 lb 12.8 oz)    LMP 11/12/2016    SpO2 100%    BMI 29.59 kg/m  If your blood pressure (bp) was elevated above 135/85 this visit, please have this repeated by your doctor within one month. --------------

## 2016-11-27 ENCOUNTER — Ambulatory Visit (HOSPITAL_COMMUNITY): Payer: Self-pay | Admitting: Psychology

## 2017-01-08 ENCOUNTER — Ambulatory Visit (INDEPENDENT_AMBULATORY_CARE_PROVIDER_SITE_OTHER): Payer: BLUE CROSS/BLUE SHIELD | Admitting: Psychiatry

## 2017-01-08 ENCOUNTER — Encounter (HOSPITAL_COMMUNITY): Payer: Self-pay | Admitting: Psychiatry

## 2017-01-08 DIAGNOSIS — Z638 Other specified problems related to primary support group: Secondary | ICD-10-CM | POA: Diagnosis not present

## 2017-01-08 DIAGNOSIS — F4310 Post-traumatic stress disorder, unspecified: Secondary | ICD-10-CM | POA: Diagnosis not present

## 2017-01-08 DIAGNOSIS — G4700 Insomnia, unspecified: Secondary | ICD-10-CM

## 2017-01-08 DIAGNOSIS — F3131 Bipolar disorder, current episode depressed, mild: Secondary | ICD-10-CM | POA: Diagnosis not present

## 2017-01-08 MED ORDER — AMITRIPTYLINE HCL 50 MG PO TABS: 50.0000 mg | ORAL_TABLET | Freq: Every day | ORAL | 2 refills | Status: DC

## 2017-01-08 MED ORDER — LAMOTRIGINE 200 MG PO TABS: 200.0000 mg | ORAL_TABLET | Freq: Every day | ORAL | 2 refills | Status: DC

## 2017-01-08 NOTE — Progress Notes (Signed)
BH MD/PA/NP OP Progress Note  01/08/2017 10:34 AM Vanessa PonsSarah Nelson  MRN:  [id number]   Chief Complaint:  Chief Complaint    Follow-up; Depression     Subjective:  I like increase Lamictal.  I still have irritability and mood swings.  I had an argument with my stepfather last month and since then I'm not talking to my mother and stepfather.  HPI: Vanessa SagoSarah came for her follow-up appointment.  On her last visit we increased the Lamictal and started amitriptyline for headaches and insomnia.  She is feeling better with increase Lamictal.  She still have irritability and mood swings.  Last month she had argument with her stepfather and since then she has no contact with her mother and stepfather.  She endorse after that she had flashback and nightmares.  She like Lamictal.  She denies any crying spells, suicidal thoughts or anger issues.  However she still have irritability and mood swings.  She has no rash or itching.  She is pleased that her financial aid is approved and now she will start classes in January at [employer].  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  Her headaches are much better since started amitriptyline but she still have difficulty falling asleep.  Patient denies any hallucination, paranoia, anxiety attacks.  She couldn't see Vanessa HareLeAnn Nelson because of the conflict but she had a appointment on 23rd.  She like to keep that appointment.  Her energy level is good.  She was recently seen in the emergency room for UTI and she recently finished antibiotic.  Patient denies drinking alcohol or using any illegal substances.  Visit Diagnosis:    ICD-10-CM   1. Bipolar affective disorder, currently depressed, mild (HCC) F31.31 lamoTRIgine (LAMICTAL) 200 MG tablet    amitriptyline (ELAVIL) 50 MG tablet    Past Psychiatric History: Reviewed. Patient has history of mood swing, mania, impulsive  behavior depression since the teens.  [facility] and given Prozac but it makes her depression worse.  She also saw psychiatrist in [place] and given Lamictal but it was discontinued when [facility] due to severe depression and having suicidal thoughts.  She had tried Risperdal, Zoloft by Dr. Ladona Ridgelaylor in the office and recently tried Wellbutrin but causes insomnia.  In the past she has taken amitriptyline and hydroxyzine for migraine headaches which works well.  Past Medical History:  Past Medical History:  Diagnosis Date  . Anxiety   . Fracture of upper end of left tibia 07/06/2014   fell while skateboarding  . Insomnia   . Migraines     Past Surgical History:  Procedure Laterality Date  . CHONDROPLASTY Left 07/09/2014   Procedure: CHONDROPLASTY;  Surgeon: Sheral Apleyimothy D Murphy, MD;  Location: [facility];  Service: Orthopedics;  Laterality: Left;  . DENTAL SURGERY     for impacted teeth  . KNEE ARTHROSCOPY Left 07/09/2014   Procedure: ARTHROSCOPY KNEE;  Surgeon: Sheral Apleyimothy D Murphy, MD;  Location: [facility];  Service: Orthopedics;  Laterality: Left;  . ORIF TIBIA PLATEAU Left 07/09/2014   Procedure: OPEN REDUCTION INTERNAL FIXATION (ORIF) TIBIAL PLATEAU;  Surgeon: Sheral Apleyimothy D Murphy, MD;  Location: [facility];  Service: Orthopedics;  Laterality: Left;    Family Psychiatric History: Reviewed.  Family History: History reviewed.  No pertinent family history.  Social History:  Social History   Social History  . Marital status: Single    Spouse name: N/A  . Number of children: N/A  . Years of education: N/A   Social History Main Topics  . Smoking status: Never Smoker  . Smokeless tobacco: Never Used  . Alcohol use No  . Drug use: No  . Sexual activity: Not Asked   Other Topics Concern  . None   Social History Narrative  . None    Allergies:  Allergies  Allergen  Reactions  . Oxycodone Nausea And Vomiting  . Tamiflu [Oseltamivir Phosphate] Nausea And Vomiting and Rash  . Wellbutrin [Bupropion] Nausea Only    Patient had insomnia and nausea and she could not  . Nickel Rash    Metabolic Disorder Labs: Recent Results (from the past 2160 hour(s))  Urinalysis, Routine w reflex microscopic     Status: Abnormal   Collection Time: 11/16/16  9:40 AM  Result Value Ref Range   Color, Urine YELLOW YELLOW   APPearance CLOUDY (A) CLEAR   Specific Gravity, Urine 1.025 1.005 - 1.030   pH 7.5 5.0 - 8.0   Glucose, UA NEGATIVE NEGATIVE mg/dL   Hgb urine dipstick NEGATIVE NEGATIVE   Bilirubin Urine NEGATIVE NEGATIVE   Ketones, ur NEGATIVE NEGATIVE mg/dL   Protein, ur NEGATIVE NEGATIVE mg/dL   Nitrite NEGATIVE NEGATIVE   Leukocytes, UA MODERATE (A) NEGATIVE  Pregnancy, urine     Status: None   Collection Time: 11/16/16  9:40 AM  Result Value Ref Range   Preg Test, Ur NEGATIVE NEGATIVE    Comment:        THE SENSITIVITY OF THIS METHODOLOGY IS >20 mIU/mL.   Urinalysis, Microscopic (reflex)     Status: Abnormal   Collection Time: 11/16/16  9:40 AM  Result Value Ref Range   RBC / HPF NONE SEEN 0 - 5 RBC/hpf   WBC, UA 6-30 0 - 5 WBC/hpf   Bacteria, UA MANY (A) NONE SEEN   Squamous Epithelial / LPF 6-30 (A) NONE SEEN   Mucous PRESENT   CBC with Differential     Status: None   Collection Time: 11/16/16 10:28 AM  Result Value Ref Range   WBC 7.5 4.0 - 10.5 K/uL   RBC 4.72 3.87 - 5.11 MIL/uL   Hemoglobin 14.6 12.0 - 15.0 g/dL   HCT 16.1 09.6 - 04.5 %   MCV 88.1 78.0 - 100.0 fL   MCH 30.9 26.0 - 34.0 pg   MCHC 35.1 30.0 - 36.0 g/dL   RDW 40.9 81.1 - 91.4 %   Platelets 318 150 - 400 K/uL   Neutrophils Relative % 67 %   Neutro Abs 5.1 1.7 - 7.7 K/uL   Lymphocytes Relative 25 %   Lymphs Abs 1.9 0.7 - 4.0 K/uL   Monocytes Relative 7 %   Monocytes Absolute 0.5 0.1 - 1.0 K/uL   Eosinophils Relative 1 %   Eosinophils Absolute 0.1 0.0 - 0.7 K/uL    Basophils Relative 0 %   Basophils Absolute 0.0 0.0 - 0.1 K/uL  Lipase, blood     Status: None   Collection Time: 11/16/16 10:28 AM  Result Value Ref Range   Lipase 21 11 - 51 U/L    Comment: HEMOLYSIS AT THIS LEVEL MAY AFFECT RESULT Performed at [facility], [address] [place], [place] [address]   I-Stat Chem 8, ED     Status: None  Collection Time: 11/16/16 10:40 AM  Result Value Ref Range   Sodium 139 135 - 145 mmol/L   Potassium 4.0 3.5 - 5.1 mmol/L   Chloride 101 101 - 111 mmol/L   BUN 11 6 - 20 mg/dL   Creatinine, Ser 1.61 0.44 - 1.00 mg/dL   Glucose, Bld 83 65 - 99 mg/dL   Calcium, Ion 0.96 0.45 - 1.40 mmol/L   TCO2 27 0 - 100 mmol/L   Hemoglobin 14.3 12.0 - 15.0 g/dL   HCT 40.9 81.1 - 91.4 %   No results found for: HGBA1C, MPG No results found for: PROLACTIN No results found for: CHOL, TRIG, HDL, CHOLHDL, VLDL, LDLCALC   Current Medications: Current Outpatient Prescriptions  Medication Sig Dispense Refill  . amitriptyline (ELAVIL) 50 MG tablet Take 1 tablet (50 mg total) by mouth at bedtime. 30 tablet 2  . lamoTRIgine (LAMICTAL) 200 MG tablet Take 1 tablet (200 mg total) by mouth daily. 30 tablet 2  . norethindrone-ethinyl estradiol (JUNEL 1/20) 1-20 MG-MCG tablet Take 1 tablet by mouth daily. 1 tablet daily - 28 day pack     No current facility-administered medications for this visit.     Neurologic: Headache: No Seizure: No Paresthesias: No  Musculoskeletal: Strength & Muscle Tone: within normal limits Gait & Station: normal Patient leans: N/A  Psychiatric Specialty Exam: Review of Systems  Constitutional: Negative.   HENT: Negative.   Respiratory: Negative.   Cardiovascular: Negative.   Gastrointestinal: Negative.   Genitourinary: Negative.   Musculoskeletal: Negative.   Skin: Negative.  Negative for itching and rash.  Neurological: Negative.   Psychiatric/Behavioral: The patient has insomnia.     Blood pressure 122/68, pulse 76,  height 5\' 1"  (1.549 m), weight 153 lb (69.4 kg).Body mass index is 28.91 kg/m.  General Appearance: Casual  Eye Contact:  Good  Speech:  Clear and Coherent  Volume:  Normal  Mood:  Anxious  Affect:  Congruent  Thought Process:  Goal Directed  Orientation:  Full (Time, Place, and Person)  Thought Content: Logical and Rumination   Suicidal Thoughts:  No  Homicidal Thoughts:  No  Memory:  Immediate;   Good Recent;   Good Remote;   Good  Judgement:  Good  Insight:  Good  Psychomotor Activity:  Normal  Concentration:  Concentration: Good and Attention Span: Good  Recall:  Good  Fund of Knowledge: Good  Language: Good  Akathisia:  No  Handed:  Right  AIMS (if indicated):  0  Assets:  Communication Skills Desire for Improvement Resilience Social Support  ADL's:  Intact  Cognition: WNL  Sleep:  Fair     Assessment: Bipolar disorder type I.  Posttraumatic stress disorder.  Plan: I reviewed records from the emergency room, current medication and collateral information from other providers.  She still have mood lability.  Recommended to increase Lamictal 200 mg daily and amitriptyline 50 mg at bedtime.  Discussed meditation side effects and benefits.  She will see Glena Norfolk on 23rd for therapy.  Patient has no rash, itching, tremors or shakes.  Recommended to call us back if she has any question or any concern.  Discuss safety concern that anytime having active suicidal thoughts or homicidal thoughts and she need to call 911 or go to the local emergency room.  Follow-up in 3 months.  Time spent 25 minutes.  Artesha Wemhoff T., MD 01/08/2017, 10:34 AM

## 2017-01-17 ENCOUNTER — Ambulatory Visit (INDEPENDENT_AMBULATORY_CARE_PROVIDER_SITE_OTHER): Payer: BLUE CROSS/BLUE SHIELD | Admitting: Psychology

## 2017-01-17 ENCOUNTER — Encounter (HOSPITAL_COMMUNITY): Payer: Self-pay | Admitting: Psychology

## 2017-01-17 DIAGNOSIS — F3131 Bipolar disorder, current episode depressed, mild: Secondary | ICD-10-CM
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
## 2017-01-30 ENCOUNTER — Encounter (HOSPITAL_COMMUNITY): Payer: Self-pay | Admitting: Psychology

## 2017-01-30 ENCOUNTER — Ambulatory Visit (HOSPITAL_COMMUNITY): Payer: Self-pay | Admitting: Psychology

## 2017-01-30 NOTE — Progress Notes (Signed)
Vanessa PonsSarah Belles is a 21 y.o. female patient who didn't show for appointment.  Letter sent.        Forde RadonYATES,Kimber Fritts, LPC

## 2017-02-18 ENCOUNTER — Other Ambulatory Visit (HOSPITAL_COMMUNITY): Payer: Self-pay | Admitting: Psychiatry

## 2017-02-18 DIAGNOSIS — F3131 Bipolar disorder, current episode depressed, mild: Secondary | ICD-10-CM

## 2017-02-27 ENCOUNTER — Ambulatory Visit (HOSPITAL_COMMUNITY): Payer: Self-pay | Admitting: Psychology

## 2017-02-27 ENCOUNTER — Encounter (HOSPITAL_COMMUNITY): Payer: Self-pay | Admitting: Psychology

## 2017-02-27 NOTE — Progress Notes (Signed)
Vanessa Nelson is a 21 y.o. female patient who didn't show for her appointment.  Letter sent.        Forde Radon, LPC

## 2017-03-08 ENCOUNTER — Encounter (HOSPITAL_COMMUNITY): Payer: Self-pay | Admitting: *Deleted

## 2017-03-08 DIAGNOSIS — R197 Diarrhea, unspecified: Secondary | ICD-10-CM | POA: Insufficient documentation

## 2017-03-08 DIAGNOSIS — Z91040 Latex allergy status: Secondary | ICD-10-CM | POA: Diagnosis not present

## 2017-03-08 DIAGNOSIS — Z79899 Other long term (current) drug therapy: Secondary | ICD-10-CM | POA: Insufficient documentation

## 2017-03-08 DIAGNOSIS — R1031 Right lower quadrant pain: Secondary | ICD-10-CM | POA: Diagnosis present

## 2017-03-08 DIAGNOSIS — N3000 Acute cystitis without hematuria: Secondary | ICD-10-CM | POA: Diagnosis not present

## 2017-03-08 LAB — CBC
HEMATOCRIT: 44.9 % (ref 36.0–46.0)
HEMOGLOBIN: 15.4 g/dL — AB (ref 12.0–15.0)
MCH: 30.6 pg (ref 26.0–34.0)
MCHC: 34.3 g/dL (ref 30.0–36.0)
MCV: 89.3 fL (ref 78.0–100.0)
Platelets: 298 10*3/uL (ref 150–400)
RBC: 5.03 MIL/uL (ref 3.87–5.11)
RDW: 12.8 % (ref 11.5–15.5)
WBC: 8.4 10*3/uL (ref 4.0–10.5)

## 2017-03-08 LAB — COMPREHENSIVE METABOLIC PANEL
ALK PHOS: 89 U/L (ref 38–126)
ALT: 15 U/L (ref 14–54)
ANION GAP: 13 (ref 5–15)
AST: 22 U/L (ref 15–41)
Albumin: 4.6 g/dL (ref 3.5–5.0)
BILIRUBIN TOTAL: 0.2 mg/dL — AB (ref 0.3–1.2)
BUN: 9 mg/dL (ref 6–20)
CO2: 23 mmol/L (ref 22–32)
Calcium: 9.5 mg/dL (ref 8.9–10.3)
Chloride: 102 mmol/L (ref 101–111)
Creatinine, Ser: 0.7 mg/dL (ref 0.44–1.00)
GFR calc non Af Amer: >60 mL/min (ref >60)
GLUCOSE: 82 mg/dL (ref 65–99)
Potassium: 3.9 mmol/L (ref 3.5–5.1)
SODIUM: 138 mmol/L (ref 135–145)
TOTAL PROTEIN: 8.6 g/dL — AB (ref 6.5–8.1)

## 2017-03-08 LAB — LIPASE, BLOOD: Lipase: 22 U/L (ref 11–51)

## 2017-03-08 MED ORDER — IBUPROFEN 200 MG PO TABS
400.0000 mg | ORAL_TABLET | Freq: Once | ORAL | Status: AC | PRN
  Administered 2017-03-08: 400 mg via ORAL
  Filled 2017-03-08: qty 2

## 2017-03-08 MED ORDER — ONDANSETRON 4 MG PO TBDP
4.0000 mg | ORAL_TABLET | Freq: Once | ORAL | Status: DC
  Filled 2017-03-08: qty 1

## 2017-03-08 NOTE — ED Triage Notes (Signed)
Pt reports being seen at urgent care today for pain in her abdomen. She was tested for UTI and pregnancy. She was sent here for further eval of right lower abdominal pain. For 2 days has had pain in the right lower abdominal pain, n & v. Fever/chills for about 1 week. Denies vaginal or urinary symptoms

## 2017-03-09 ENCOUNTER — Emergency Department (HOSPITAL_COMMUNITY): Payer: BLUE CROSS/BLUE SHIELD

## 2017-03-09 ENCOUNTER — Emergency Department (HOSPITAL_COMMUNITY)
Admission: EM | Admit: 2017-03-09 | Discharge: 2017-03-09 | Disposition: A | Discharge status: Home / Self Care | Payer: BLUE CROSS/BLUE SHIELD | Attending: Emergency Medicine | Admitting: Emergency Medicine

## 2017-03-09 ENCOUNTER — Encounter (HOSPITAL_COMMUNITY): Payer: Self-pay | Admitting: Radiology

## 2017-03-09 DIAGNOSIS — R1031 Right lower quadrant pain: Secondary | ICD-10-CM

## 2017-03-09 DIAGNOSIS — N3000 Acute cystitis without hematuria: Secondary | ICD-10-CM

## 2017-03-09 LAB — POC URINE PREG, ED: Preg Test, Ur: NEGATIVE

## 2017-03-09 LAB — URINALYSIS, ROUTINE W REFLEX MICROSCOPIC
Bilirubin Urine: NEGATIVE
GLUCOSE, UA: NEGATIVE mg/dL
Hgb urine dipstick: NEGATIVE
Ketones, ur: 20 mg/dL — AB
NITRITE: POSITIVE — AB
PH: 6 (ref 5.0–8.0)
Protein, ur: 30 mg/dL — AB
SPECIFIC GRAVITY, URINE: 1.025 (ref 1.005–1.030)

## 2017-03-09 IMAGING — CT CT ABD-PELV W/ CM
2 of 4 series · 16 of 46 positions shown, 18 images · IV contrast (iopamidol)
Comparison: CT of the abdomen and pelvis performed [DATE]

CLINICAL DATA: Acute onset of right lower quadrant abdominal pain,
nausea and vomiting. Initial encounter.

EXAM:
CT ABDOMEN AND PELVIS WITH CONTRAST
TECHNIQUE: Multidetector CT imaging of the abdomen and pelvis was performed
using the standard protocol following bolus administration of
intravenous contrast.
CONTRAST:  100mL [69] IOPAMIDOL ([69]) INJECTION 61%

[Series 2: abd/pel with · axial · 0.71mm/px · z∈[-421,-61]mm · 13 of 82 slices shown, 15 images]
[im 5/82  soft-tissue]
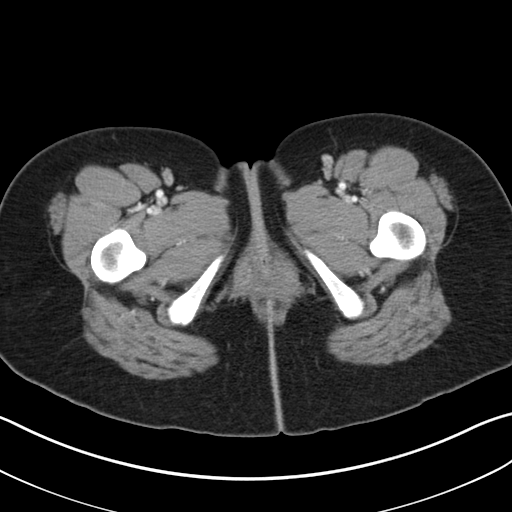
[im 5/82  bone]
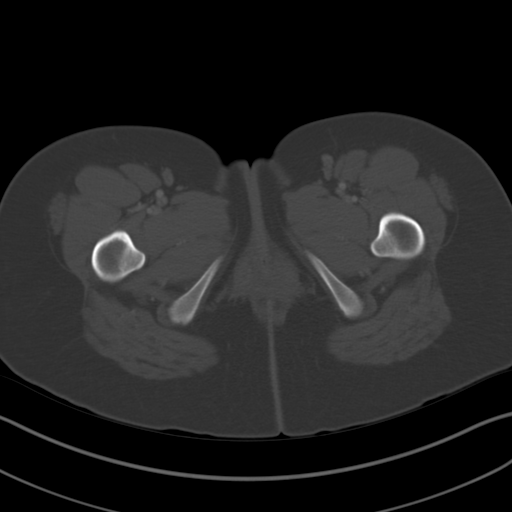
[im 10/82  soft-tissue]
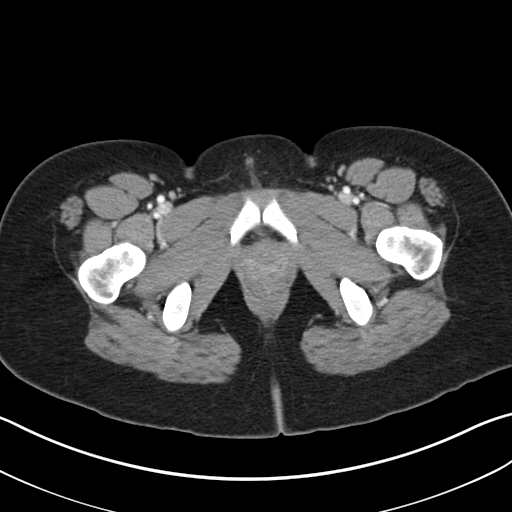
[im 20/82  soft-tissue]
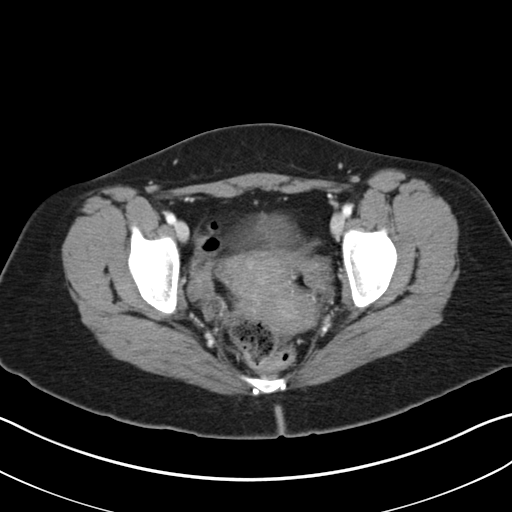
[im 24/82  soft-tissue]
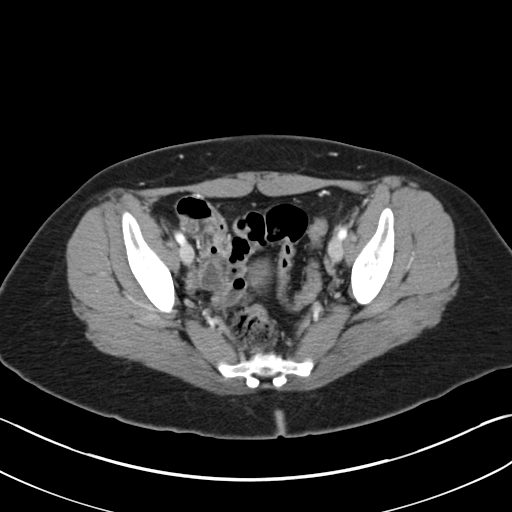
[im 29/82  soft-tissue]
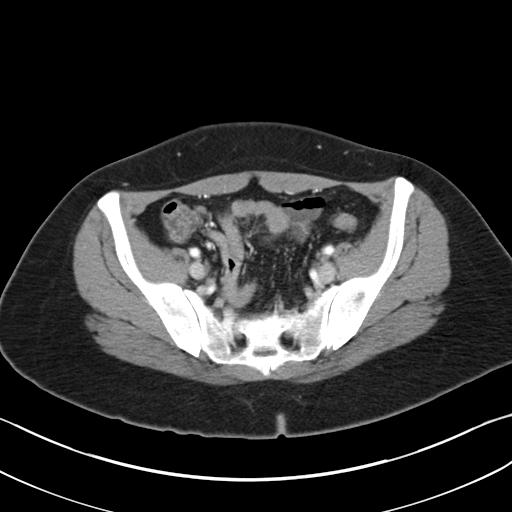
[im 34/82  soft-tissue]
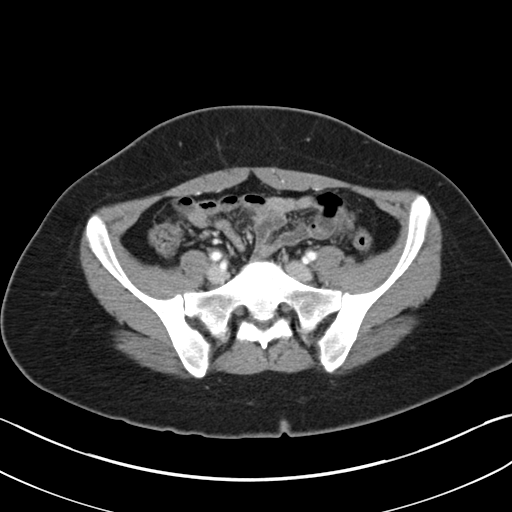
[im 43/82  soft-tissue]
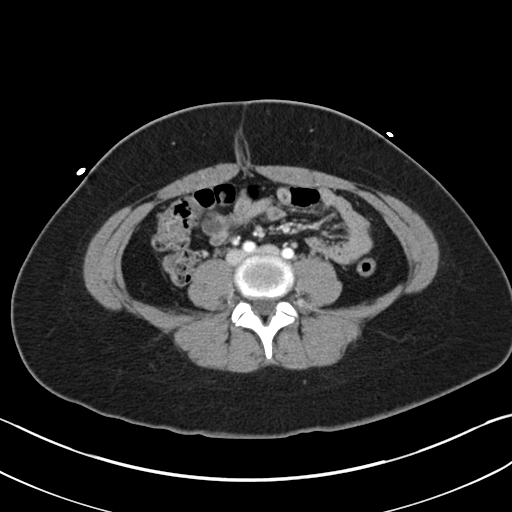
[im 48/82  soft-tissue]
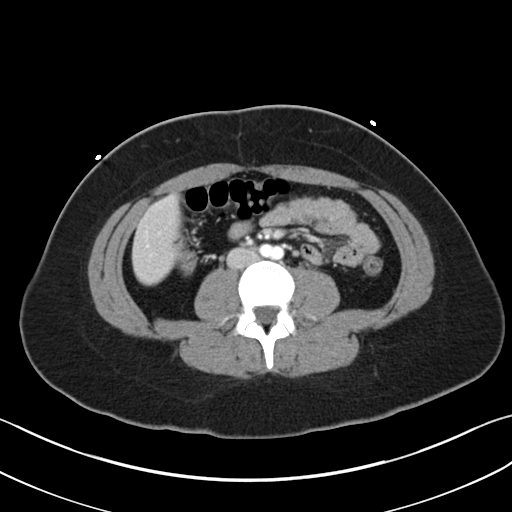
[im 53/82  soft-tissue]
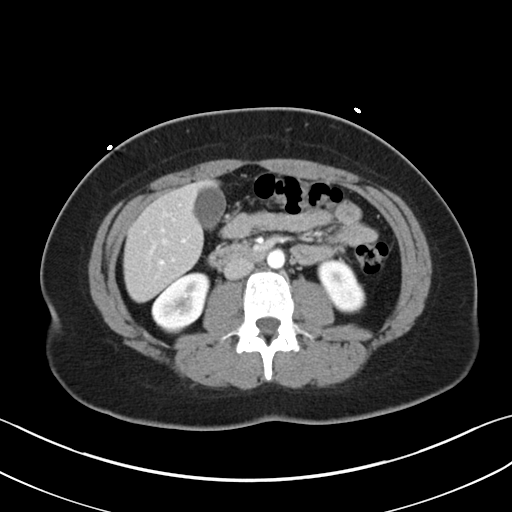
[im 53/82  bone]
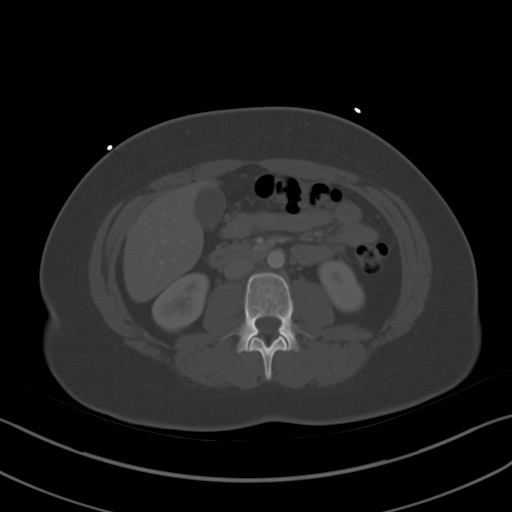
[im 58/82  soft-tissue]
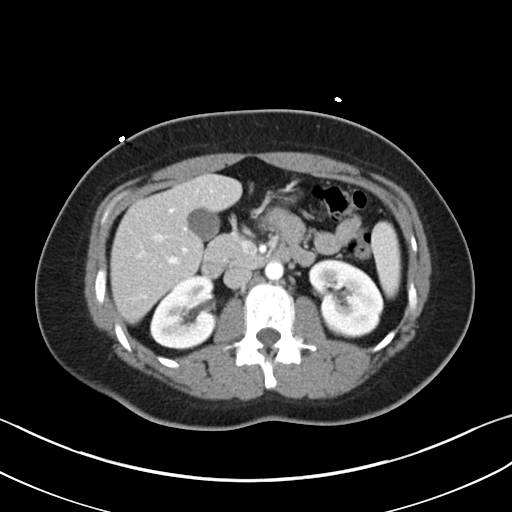
[im 62/82  soft-tissue]
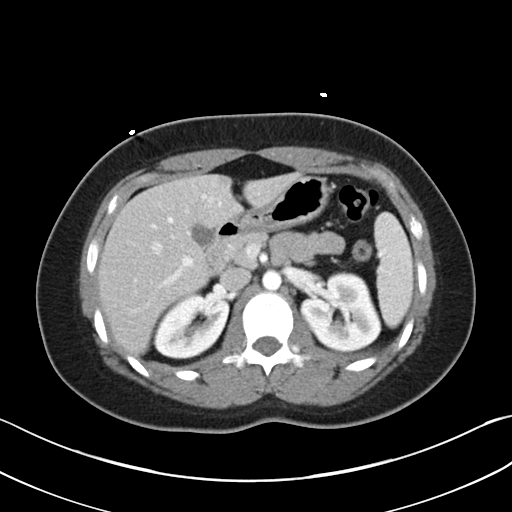
[im 72/82  soft-tissue]
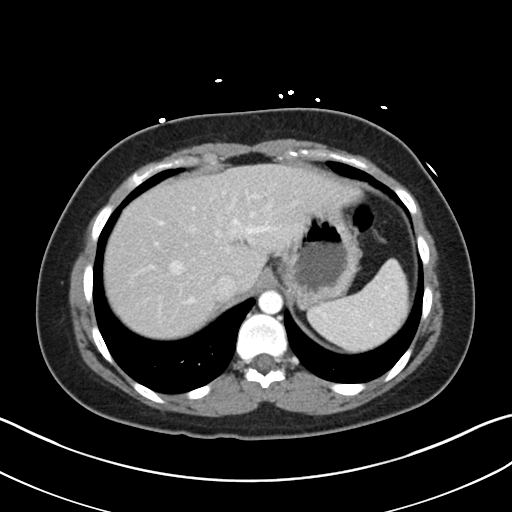
[im 77/82  soft-tissue]
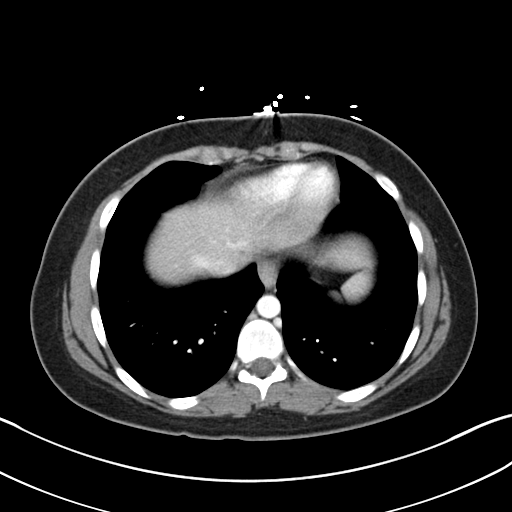

[Series 5: coronal a/|p · coronal · 0.65mm/px · 3 of 108 slices shown]
[im 36/108  soft-tissue]
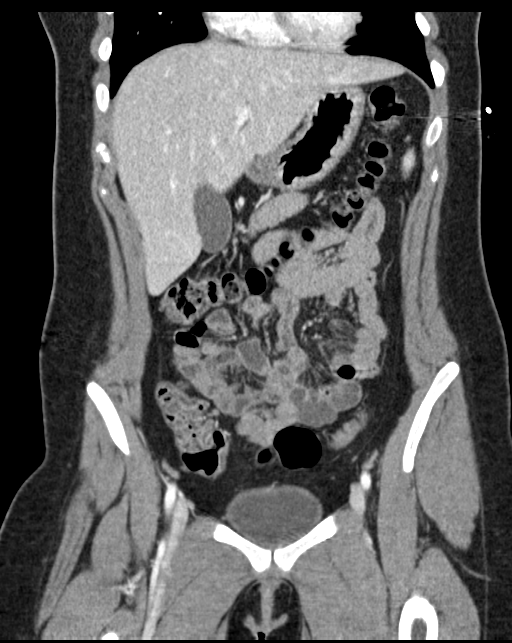
[im 48/108  soft-tissue]
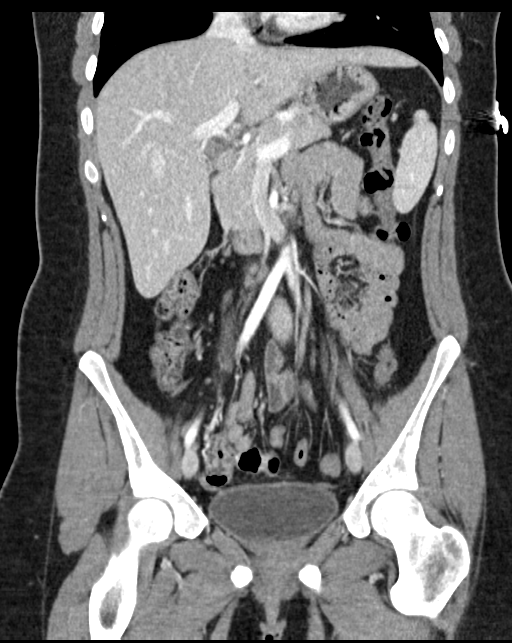
[im 60/108  soft-tissue]
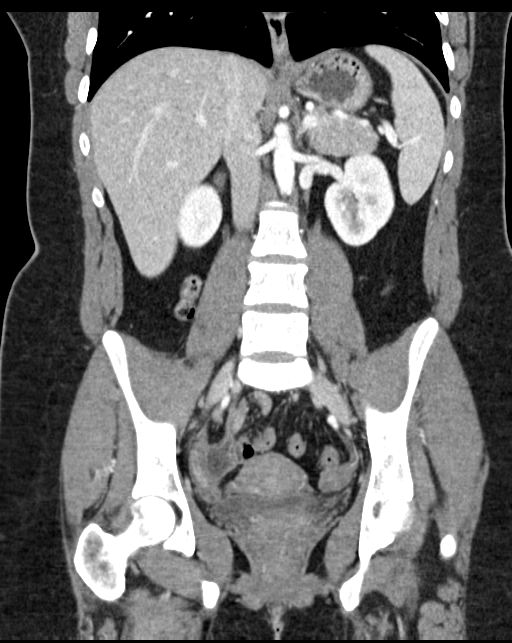

[16 of 46 positions shown; findings below may reference images not displayed]

FINDINGS: Lower chest: The visualized lung bases are grossly clear. The
visualized portions of the mediastinum are unremarkable.

Hepatobiliary: The liver is unremarkable in appearance. The
gallbladder is unremarkable in appearance. The common bile duct
remains normal in caliber.

Pancreas: The pancreas is within normal limits.

Spleen: The spleen is unremarkable in appearance.

Adrenals/Urinary Tract: The adrenal glands are unremarkable in
appearance. The kidneys are within normal limits. There is no
evidence of hydronephrosis. No renal or ureteral stones are
identified. No perinephric stranding is seen.

Stomach/Bowel: The stomach is unremarkable in appearance. The small
bowel is within normal limits. The appendix is normal in caliber,
without evidence of appendicitis. The colon is unremarkable in
appearance.

Vascular/Lymphatic: The abdominal aorta is unremarkable in
appearance. The inferior vena cava is grossly unremarkable. No
retroperitoneal lymphadenopathy is seen. No pelvic sidewall
lymphadenopathy is identified.

Reproductive: The bladder is mildly distended and grossly
unremarkable. The uterus is unremarkable in appearance. The ovaries
are relatively symmetric. No suspicious adnexal masses are seen.

Other: No additional soft tissue abnormalities are seen.

Musculoskeletal: No acute osseous abnormalities are identified. The
visualized musculature is unremarkable in appearance.
IMPRESSION: Unremarkable contrast-enhanced CT of the abdomen and pelvis.

## 2017-03-09 MED ORDER — IOPAMIDOL (ISOVUE-300) INJECTION 61%
INTRAVENOUS | Status: AC
  Administered 2017-03-09: 100 mL via INTRAVENOUS
  Filled 2017-03-09: qty 100

## 2017-03-09 MED ORDER — MORPHINE SULFATE (PF) 4 MG/ML IV SOLN
4.0000 mg | Freq: Once | INTRAVENOUS | Status: AC
  Administered 2017-03-09: 4 mg via INTRAVENOUS
  Filled 2017-03-09: qty 1

## 2017-03-09 MED ORDER — IOPAMIDOL (ISOVUE-300) INJECTION 61%
100.0000 mL | Freq: Once | INTRAVENOUS | Status: AC | PRN
  Administered 2017-03-09: 100 mL via INTRAVENOUS

## 2017-03-09 MED ORDER — CEPHALEXIN 500 MG PO CAPS: 500.0000 mg | ORAL_CAPSULE | Freq: Two times a day (BID) | ORAL | 0 refills | Status: DC

## 2017-03-09 NOTE — Discharge Instructions (Signed)
Your urine today did show signs of infection. Take the prescribed medication as directed.  You have to start drinking more water like we talked about. Follow-up with your primary care doctor if any ongoing issues. Return to the ED for new or worsening symptoms.

## 2017-03-09 NOTE — ED Notes (Signed)
Requested urine from patient. 

## 2017-03-09 NOTE — ED Notes (Signed)
Patient is alert and oriented x3.  She was given DC instructions and follow up visit instructions.  Patient gave verbal understanding. She was DC ambulatory under her own power to home.  V/S stable.  He was not showing any signs of distress on DC 

## 2017-03-09 NOTE — ED Provider Notes (Signed)
[facility]-EMERGENCY DEPT Provider Note   CSN: [id number] Arrival date & time: 03/08/17  1918     History   Chief Complaint Chief Complaint  Patient presents with  . Abdominal Pain    HPI Vanessa Nelson is a 21 y.o. female.  The history is provided by the patient and medical records.     21 year old female with history of anxiety, insomnia, migraine headaches, bipolar disorder, depression, presenting to the ED with abdominal pain. Reports for the past week she has had intermittent nausea, vomiting, and diarrhea, but over the past 2 days she has developed focal right lower quadrant abdominal pain as well as low-grade fever and chills. States she has had multiple UTIs in the past and went to urgent care thinking that was what it is. She had a urinalysis done there which was negative. She was sent here to rule out appendicitis. She does report this pain is more intense than previous. She denies any current dysuria, hematuria, or urinary frequency. No pelvic pain or vaginal complaints.  No history of ovarian cysts. Does report her menstrual cycles have been irregular over the past few months but has not followed up with GYN about this.  Past Medical History:  Diagnosis Date  . Anxiety   . Fracture of upper end of left tibia 07/06/2014   fell while skateboarding  . Insomnia   . Migraines     Patient Active Problem List   Diagnosis Date Noted  . Bipolar 1 disorder, depressed, mild (HCC) 10/04/2016    Class: Chronic  . 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 CHONDROPLASTY Left 07/09/2014   Procedure: CHONDROPLASTY;  Surgeon: Sheral Apley, MD;  Location: [facility];  Service: Orthopedics;  Laterality: Left;  . DENTAL SURGERY     for impacted teeth  . KNEE ARTHROSCOPY Left 07/09/2014   Procedure: ARTHROSCOPY KNEE;  Surgeon: Sheral Apley, MD;  Location: [facility];  Service: Orthopedics;  Laterality: Left;  . ORIF TIBIA PLATEAU Left 07/09/2014   Procedure: OPEN REDUCTION INTERNAL FIXATION (ORIF) TIBIAL PLATEAU;  Surgeon: Sheral Apley, MD;  Location: [facility];  Service: Orthopedics;  Laterality: Left;    OB History    No data available       Home Medications    Prior to Admission medications   Medication Sig Start Date End Date Taking? Authorizing Provider  JUNEL FE 1/20 1-20 MG-MCG tablet Take 1 tablet by mouth daily. 03/07/17  Yes [provider]  lamoTRIgine (LAMICTAL) 200 MG tablet Take 1 tablet (200 mg total) by mouth daily. Patient taking differently: Take 100 mg by mouth daily.  01/08/17  Yes Arfeen, Phillips Grout, MD  amitriptyline (ELAVIL) 50 MG tablet Take 1 tablet (50 mg total) by mouth at bedtime. Patient not taking: Reported on 03/09/2017 01/08/17   Arfeen, Phillips Grout, MD    Family History No family history on file.  Social History Social History  Substance Use Topics  . Smoking status: Never Smoker  . Smokeless tobacco: Never Used  . Alcohol use No     Allergies   Oxycodone; Tamiflu [oseltamivir phosphate]; Latex; Wellbutrin [bupropion]; and Nickel   Review of Systems Review of Systems  Constitutional: Positive for chills.  Gastrointestinal: Positive for abdominal pain, diarrhea, nausea and vomiting.  All other systems reviewed and are negative.    Physical Exam Updated Vital Signs BP 130/74 (BP  Location: Right Arm)   Pulse 85   Temp 98.6 F (37 C) (Oral)   Resp (!) 22   Ht  (1.549 m)   Wt 65.8 kg (145 lb)   LMP 02/01/2017 Comment: negative pregnancy test result 03-09-17  SpO2 100%   BMI 27.40 kg/m   Physical Exam  Constitutional: She is oriented to person, place, and time. She appears well-developed and well-nourished.  HENT:  Head: Normocephalic and atraumatic.  Mouth/Throat: Oropharynx is clear and moist.  Eyes: Pupils are equal, round, and reactive to light.  Conjunctivae and EOM are normal.  Neck: Normal range of motion.  Cardiovascular: Normal rate, regular rhythm and normal heart sounds.   Pulmonary/Chest: Effort normal and breath sounds normal.  Abdominal: Soft. Bowel sounds are normal. There is tenderness in the right lower quadrant and periumbilical area.  Musculoskeletal: Normal range of motion.  Neurological: She is alert and oriented to person, place, and time.  Skin: Skin is warm and dry.  Psychiatric: She has a normal mood and affect.  Nursing note and vitals reviewed.    ED Treatments / Results  Labs (all labs ordered are listed, but only abnormal results are displayed) Labs Reviewed  COMPREHENSIVE METABOLIC PANEL - Abnormal; Notable for the following:       Result Value   Total Protein 8.6 (*)    Total Bilirubin 0.2 (*)    All other components within normal limits  CBC - Abnormal; Notable for the following:    Hemoglobin 15.4 (*)    All other components within normal limits  URINALYSIS, ROUTINE W REFLEX MICROSCOPIC - Abnormal; Notable for the following:    Color, Urine AMBER (*)    APPearance CLOUDY (*)    Ketones, ur 20 (*)    Protein, ur 30 (*)    Nitrite POSITIVE (*)    Leukocytes, UA MODERATE (*)    Bacteria, UA MANY (*)    Squamous Epithelial / LPF 0-5 (*)    Crystals PRESENT (*)    All other components within normal limits  LIPASE, BLOOD  POC URINE PREG, ED    EKG  EKG Interpretation None       Radiology No results found.  Procedures Procedures (including critical care time)  Medications Ordered in ED Medications  ondansetron (ZOFRAN-ODT) disintegrating tablet 4 mg (4 mg Oral Not Given 03/09/17 0207)  ibuprofen (ADVIL,MOTRIN) tablet 400 mg (400 mg Oral Given 03/08/17 2213)  morphine 4 MG/ML injection 4 mg (4 mg Intravenous Given 03/09/17 0224)  iopamidol (ISOVUE-300) 61 % injection 100 mL (100 mLs Intravenous Contrast Given 03/09/17 0243)     Initial Impression / Assessment and Plan / ED  Course  I have reviewed the triage vital signs and the nursing notes.  Pertinent labs & imaging results that were available during my care of the patient were reviewed by me and considered in my medical decision making (see chart for details).  21 year old female here with right lower quadrant abdominal pain. Seen at urgent care and had pregnancy test as well as urinalysis and was sent here with concern of appendicitis. Patient denies any current urinary symptoms or pelvic complaints. She does have history of recurrent UTIs in the past but reports this feels different.  Patient does have tenderness in the periumbilical region and right lower quadrant. Screening labs obtained from triage and are overall reassuring. Will obtain CT scan.  Pain meds given.  CT scan without acute findings. Delay in UA resulting due to computer glitch in  the lab but does appear infectious with many bacteria, nitrite +.  When speaking with patient about this, states she has been getting UTIs about every 3 months or so recently. Mother admits that she does not drink any water, she only drinks soda. This may be contributing to her symptoms and I discussed this with her. Will start on course of Keflex. Follow-up with PCP.  Discussed plan with patient, she acknowledged understanding and agreed with plan of care.  Return precautions given for new or worsening symptoms.  Final Clinical Impressions(s) / ED Diagnoses   Final diagnoses:  Right lower quadrant abdominal pain  Acute cystitis without hematuria    New Prescriptions New Prescriptions   CEPHALEXIN (KEFLEX) 500 MG CAPSULE    Take 1 capsule (500 mg total) by mouth 2 (two) times daily.     Garlon Hatchet, PA-C 03/09/17 1610    Lorre Nick, MD 03/10/17 [phone]

## 2017-03-21 ENCOUNTER — Ambulatory Visit (HOSPITAL_COMMUNITY): Payer: Self-pay | Admitting: Psychology

## 2017-03-21 ENCOUNTER — Encounter (HOSPITAL_COMMUNITY): Payer: Self-pay | Admitting: Psychology

## 2017-03-21 NOTE — Progress Notes (Signed)
Vanessa Nelson is a 21 y.o. female patient who didn't show for appointment.  Letter sent.        YATES,LEANNE, LPC 

## 2017-04-08 ENCOUNTER — Ambulatory Visit (HOSPITAL_COMMUNITY): Payer: Self-pay | Admitting: Psychology

## 2017-04-10 ENCOUNTER — Ambulatory Visit (HOSPITAL_COMMUNITY): Payer: Self-pay | Admitting: Psychiatry

## 2017-04-19 ENCOUNTER — Other Ambulatory Visit: Payer: Self-pay

## 2017-04-19 ENCOUNTER — Emergency Department (HOSPITAL_COMMUNITY)
Admission: EM | Admit: 2017-04-19 | Discharge: 2017-04-19 | Disposition: A | Discharge status: Home / Self Care | Payer: BLUE CROSS/BLUE SHIELD | Attending: Emergency Medicine | Admitting: Emergency Medicine

## 2017-04-19 ENCOUNTER — Emergency Department (HOSPITAL_COMMUNITY): Payer: BLUE CROSS/BLUE SHIELD

## 2017-04-19 ENCOUNTER — Encounter (HOSPITAL_COMMUNITY): Payer: Self-pay

## 2017-04-19 DIAGNOSIS — F319 Bipolar disorder, unspecified: Secondary | ICD-10-CM | POA: Insufficient documentation

## 2017-04-19 DIAGNOSIS — R1031 Right lower quadrant pain: Secondary | ICD-10-CM | POA: Diagnosis present

## 2017-04-19 DIAGNOSIS — F419 Anxiety disorder, unspecified: Secondary | ICD-10-CM | POA: Insufficient documentation

## 2017-04-19 DIAGNOSIS — Z91040 Latex allergy status: Secondary | ICD-10-CM | POA: Diagnosis not present

## 2017-04-19 DIAGNOSIS — Z79899 Other long term (current) drug therapy: Secondary | ICD-10-CM | POA: Insufficient documentation

## 2017-04-19 DIAGNOSIS — N939 Abnormal uterine and vaginal bleeding, unspecified: Secondary | ICD-10-CM | POA: Diagnosis not present

## 2017-04-19 DIAGNOSIS — R102 Pelvic and perineal pain: Secondary | ICD-10-CM | POA: Diagnosis not present

## 2017-04-19 LAB — URINALYSIS, ROUTINE W REFLEX MICROSCOPIC

## 2017-04-19 LAB — WET PREP, GENITAL
CLUE CELLS WET PREP: NONE SEEN
Sperm: NONE SEEN
Trich, Wet Prep: NONE SEEN
Yeast Wet Prep HPF POC: NONE SEEN

## 2017-04-19 LAB — POC URINE PREG, ED: PREG TEST UR: NEGATIVE

## 2017-04-19 MED ORDER — KETOROLAC TROMETHAMINE 60 MG/2ML IM SOLN
30.0000 mg | Freq: Once | INTRAMUSCULAR | Status: AC
  Administered 2017-04-19: 30 mg via INTRAMUSCULAR
  Filled 2017-04-19: qty 2

## 2017-04-19 MED ORDER — KETOROLAC TROMETHAMINE 60 MG/2ML IM SOLN: 60.0000 mg | Freq: Once | INTRAMUSCULAR | Status: DC

## 2017-04-19 MED ORDER — NAPROXEN 500 MG PO TABS: 500.0000 mg | ORAL_TABLET | Freq: Two times a day (BID) | ORAL | 0 refills | Status: DC

## 2017-04-19 NOTE — ED Notes (Signed)
Patient transported to Ultrasound 

## 2017-04-19 NOTE — ED Triage Notes (Signed)
Pt presents for evaluation of lower abd/pelvic pain with vaginal bleeding. Pt reports dx with endometriosis and multiple cysts, states OBGYN sent her here for evaluation.

## 2017-04-19 NOTE — ED Notes (Signed)
ED Provider at bedside. 

## 2017-04-19 NOTE — Discharge Instructions (Signed)
Take naprosyn for pain and inflammation. Try heating pads. Follow up with OB/GYN for further evaluation and treatment of your abdominal pain and irregular bleeding. Return if worsening symptoms.

## 2017-04-19 NOTE — ED Provider Notes (Signed)
[facility] EMERGENCY DEPARTMENT Provider Note   CSN: [id number] Arrival date & time: 04/19/17  5621     History   Chief Complaint Chief Complaint  Patient presents with  . Abdominal Pain    HPI Vanessa Nelson is a 21 y.o. female.  HPI Vanessa Nelson is a 21 y.o. female presents to emergency department complaining of abdominal pain and vaginal bleeding.  Patient states that she has had right lower quadrant abdominal pain for 2 months now.  She has had numerous emergency department visits, gastroenterology visit, [facility].  She states no one can figure this out.  She has had pelvic ultrasounds, abdominal ultrasounds, CT abdomen pelvis, and MRI of her abdomen which showed no source of her pain.  Her ultrasound did show that she had numerous of the follicles with no large cysts.  The [facility] provider thought that she may have endometriosis and was going to refer her to OB/GYN.  She had an MRI of her abdomen and pelvis just a few weeks ago which was normal.  She states that she has associated nausea and vomiting.  She has normal bowel movements.  She denies any urinary symptoms.  She states that this morning she has developed heavy vaginal bleeding, going through 4 pads an hour.  She states that is unusual for her and that is what made her come to the ER.  She is on birth control, states she has not missed any doses.  This is week 2 of her birth control and it is unusual for her to bleed like this.  She denies being pregnant.  She took 2 Excedrin this morning which has not helped.  Past Medical History:  Diagnosis Date  . Anxiety   . Fracture of upper end of left tibia 07/06/2014   fell while skateboarding  . Insomnia   . Migraines     Patient Active Problem List   Diagnosis Date Noted  . Bipolar 1 disorder, depressed, mild (HCC) 10/04/2016    Class: Chronic  . MDD (major depressive disorder), recurrent severe, without psychosis (HCC)  09/26/2016    Past Surgical History:  Procedure Laterality Date  . CHONDROPLASTY Left 07/09/2014   Procedure: CHONDROPLASTY;  Surgeon: Sheral Apley, MD;  Location: [facility];  Service: Orthopedics;  Laterality: Left;  . DENTAL SURGERY     for impacted teeth  . KNEE ARTHROSCOPY Left 07/09/2014   Procedure: ARTHROSCOPY KNEE;  Surgeon: Sheral Apley, MD;  Location: [facility];  Service: Orthopedics;  Laterality: Left;  . ORIF TIBIA PLATEAU Left 07/09/2014   Procedure: OPEN REDUCTION INTERNAL FIXATION (ORIF) TIBIAL PLATEAU;  Surgeon: Sheral Apley, MD;  Location: [facility];  Service: Orthopedics;  Laterality: Left;    OB History    No data available       Home Medications    Prior to Admission medications   Medication Sig Start Date End Date Taking? Authorizing Provider  amitriptyline (ELAVIL) 50 MG tablet Take 1 tablet (50 mg total) by mouth at bedtime. Patient not taking: Reported on 03/09/2017 01/08/17   Arfeen, Phillips Grout, MD  cephALEXin (KEFLEX) 500 MG capsule Take 1 capsule (500 mg total) by mouth 2 (two) times daily. 03/09/17   Garlon Hatchet, PA-C  JUNEL FE 1/20 1-20 MG-MCG tablet Take 1 tablet by mouth daily. 03/07/17   [provider]  lamoTRIgine (LAMICTAL) 200 MG tablet Take 1 tablet (200 mg total) by mouth daily. Patient  taking differently: Take 100 mg by mouth daily.  01/08/17   Cleotis NipperArfeen, Syed T, MD    Family History No family history on file.  Social History Social History   Tobacco Use  . Smoking status: Never Smoker  . Smokeless tobacco: Never Used  Substance Use Topics  . Alcohol use: No  . Drug use: No     Allergies   Oxycodone; Tamiflu [oseltamivir phosphate]; Latex; Wellbutrin [bupropion]; and Nickel   Review of Systems Review of Systems  Constitutional: Negative for chills and fever.  Respiratory: Negative for cough, chest tightness and shortness of breath.   Cardiovascular: Negative for  chest pain, palpitations and leg swelling.  Gastrointestinal: Positive for abdominal pain, nausea and vomiting. Negative for diarrhea.  Genitourinary: Positive for pelvic pain and vaginal bleeding. Negative for dysuria, flank pain, vaginal discharge and vaginal pain.  Musculoskeletal: Negative for arthralgias, myalgias, neck pain and neck stiffness.  Skin: Negative for rash.  Neurological: Negative for dizziness, weakness and headaches.  All other systems reviewed and are negative.    Physical Exam Updated Vital Signs BP 126/80 (BP Location: Right Arm)   Pulse 77   Temp 98.2 F (36.8 C) (Oral)   Resp 16   LMP 12/26/2016 (Approximate)   SpO2 100%   Physical Exam  Constitutional: She appears well-developed and well-nourished. No distress.  HENT:  Head: Normocephalic.  Eyes: Conjunctivae are normal.  Neck: Neck supple.  Cardiovascular: Normal rate, regular rhythm and normal heart sounds.  Pulmonary/Chest: Effort normal and breath sounds normal. No respiratory distress. She has no wheezes. She has no rales.  Abdominal: Soft. Bowel sounds are normal. She exhibits no distension. There is tenderness in the right lower quadrant. There is guarding. There is no rebound.  Genitourinary:  Genitourinary Comments: Normal external genitalia.  Moderate vaginal bleeding with small clots.  Cervix is normal.  Positive cervical motion tenderness, uterine tenderness, right adnexal tenderness.  Musculoskeletal: She exhibits no edema.  Neurological: She is alert.  Skin: Skin is warm and dry.  Psychiatric: She has a normal mood and affect. Her behavior is normal.  Nursing note and vitals reviewed.    ED Treatments / Results  Labs (all labs ordered are listed, but only abnormal results are displayed) Labs Reviewed - No data to display  EKG  EKG Interpretation None       Radiology No results found.  Procedures Procedures (including critical care time)  Medications Ordered in  ED Medications - No data to display   Initial Impression / Assessment and Plan / ED Course  I have reviewed the triage vital signs and the nursing notes.  Pertinent labs & imaging results that were available during my care of the patient were reviewed by me and considered in my medical decision making (see chart for details).     Patient with persistent right lower quadrant pain for the last 2 months, with multiple evaluations, with a workup including ultrasounds, CT abdomen pelvis, MRI of the abdomen.  No explanation for the pain at this time.  Patient today with heavy vaginal bleeding.  Will perform pelvic exam, get pregnancy test, repeat ultrasound.  3:29 PM Urine is contaminated.  Patient denies any urinary symptoms.  Pelvic exam unremarkable other than bleeding.  Ultrasound is negative.  At this time I have low concern for appendicitis or GI related symptoms, since patient's symptoms have been there for 2 months and she has had numerous tests as part of the workup including MRI and CT of abdomen.  Patient does not have any GI symptoms.  She is nontoxic appearing.  She has normal vital signs.  This time she is stable for discharge home with close follow-up with GYN for her dysfunctional uterine bleeding and pelvic pain.  Return precautions discussed.  Vitals:   04/19/17 1200 04/19/17 1215 04/19/17 1230 04/19/17 1349  BP: 113/77 112/73 109/61 108/76  Pulse: 71 67 78 88  Resp:    16  Temp:      TempSrc:      SpO2: 100% 99% 97% 98%     Final Clinical Impressions(s) / ED Diagnoses   Final diagnoses:  Pelvic pain in female  Vaginal bleeding    ED Discharge Orders        Ordered    naproxen (NAPROSYN) 500 MG tablet  2 times daily     04/19/17 1528       Jaynie CrumbleKirichenko, Aries Townley, PA-C 04/19/17 1533    Mancel BaleWentz, Elliott, MD 04/19/17 1652

## 2017-04-22 LAB — GC/CHLAMYDIA PROBE AMP (~~LOC~~) NOT AT ARMC
CHLAMYDIA, DNA PROBE: NEGATIVE
NEISSERIA GONORRHEA: NEGATIVE

## 2017-04-29 ENCOUNTER — Ambulatory Visit (HOSPITAL_COMMUNITY): Payer: Self-pay | Admitting: Psychology

## 2017-06-18 ENCOUNTER — Encounter: Payer: Self-pay | Admitting: *Deleted

## 2017-07-01 ENCOUNTER — Ambulatory Visit (INDEPENDENT_AMBULATORY_CARE_PROVIDER_SITE_OTHER): Payer: BLUE CROSS/BLUE SHIELD | Admitting: Obstetrics & Gynecology

## 2017-07-01 ENCOUNTER — Encounter: Payer: Self-pay | Admitting: Obstetrics & Gynecology

## 2017-07-01 VITALS — BP 118/76 | HR 78 | Ht 61.0 in | Wt 149.3 lb

## 2017-07-01 DIAGNOSIS — Z124 Encounter for screening for malignant neoplasm of cervix: Secondary | ICD-10-CM | POA: Diagnosis not present

## 2017-07-01 DIAGNOSIS — Z01419 Encounter for gynecological examination (general) (routine) without abnormal findings: Secondary | ICD-10-CM | POA: Diagnosis not present

## 2017-07-01 DIAGNOSIS — N939 Abnormal uterine and vaginal bleeding, unspecified: Secondary | ICD-10-CM

## 2017-07-01 DIAGNOSIS — R102 Pelvic and perineal pain: Secondary | ICD-10-CM

## 2017-07-01 DIAGNOSIS — Z113 Encounter for screening for infections with a predominantly sexual mode of transmission: Secondary | ICD-10-CM

## 2017-07-01 LAB — POCT PREGNANCY, URINE: PREG TEST UR: NEGATIVE

## 2017-07-01 MED ORDER — NORGESTIMATE-ETH ESTRADIOL 0.25-35 MG-MCG PO TABS: 1.0000 | ORAL_TABLET | Freq: Every day | ORAL | 11 refills | Status: DC

## 2017-07-01 NOTE — Patient Instructions (Signed)
Bacterial Vaginosis Bacterial vaginosis is a vaginal infection that occurs when the normal balance of bacteria in the vagina is disrupted. It results from an overgrowth of certain bacteria. This is the most common vaginal infection among women ages 15-44. Because bacterial vaginosis increases your risk for STIs (sexually transmitted infections), getting treated can help reduce your risk for chlamydia, gonorrhea, herpes, and HIV (human immunodeficiency virus). Treatment is also important for preventing complications in pregnant women, because this condition can cause an early (premature) delivery. What are the causes? This condition is caused by an increase in harmful bacteria that are normally present in small amounts in the vagina. However, the reason that the condition develops is not fully understood. What increases the risk? The following factors may make you more likely to develop this condition:  Having a new sexual partner or multiple sexual partners.  Having unprotected sex.  Douching.  Having an intrauterine device (IUD).  Smoking.  Drug and alcohol abuse.  Taking certain antibiotic medicines.  Being pregnant.  You cannot get bacterial vaginosis from toilet seats, bedding, swimming pools, or contact with objects around you. What are the signs or symptoms? Symptoms of this condition include:  Grey or white vaginal discharge. The discharge can also be watery or foamy.  A fish-like odor with discharge, especially after sexual intercourse or during menstruation.  Itching in and around the vagina.  Burning or pain with urination.  Some women with bacterial vaginosis have no signs or symptoms. How is this diagnosed? This condition is diagnosed based on:  Your medical history.  A physical exam of the vagina.  Testing a sample of vaginal fluid under a microscope to look for a large amount of bad bacteria or abnormal cells. Your health care provider may use a cotton swab  or a small wooden spatula to collect the sample.  How is this treated? This condition is treated with antibiotics. These may be given as a pill, a vaginal cream, or a medicine that is put into the vagina (suppository). If the condition comes back after treatment, a second round of antibiotics may be needed. Follow these instructions at home: Medicines  Take over-the-counter and prescription medicines only as told by your health care provider.  Take or use your antibiotic as told by your health care provider. Do not stop taking or using the antibiotic even if you start to feel better. General instructions  If you have a female sexual partner, tell her that you have a vaginal infection. She should see her health care provider and be treated if she has symptoms. If you have a female sexual partner, he does not need treatment.  During treatment: ? Avoid sexual activity until you finish treatment. ? Do not douche. ? Avoid alcohol as directed by your health care provider. ? Avoid breastfeeding as directed by your health care provider.  Drink enough water and fluids to keep your urine clear or pale yellow.  Keep the area around your vagina and rectum clean. ? Wash the area daily with warm water. ? Wipe yourself from front to back after using the toilet.  Keep all follow-up visits as told by your health care provider. This is important. How is this prevented?  Do not douche.  Wash the outside of your vagina with warm water only.  Use protection when having sex. This includes latex condoms and dental dams.  Limit how many sexual partners you have. To help prevent bacterial vaginosis, it is best to have sex with just   one partner (monogamous).  Make sure you and your sexual partner are tested for STIs.  Wear cotton or cotton-lined underwear.  Avoid wearing tight pants and pantyhose, especially during summer.  Limit the amount of alcohol that you drink.  Do not use any products that  contain nicotine or tobacco, such as cigarettes and e-cigarettes. If you need help quitting, ask your health care provider.  Do not use illegal drugs. Where to find more information:  Centers for Disease Control and Prevention: www.cdc.gov/std  American Sexual Health Association (ASHA): www.ashastd.org  U.S. Department of Health and Human Services, Office on Women's Health: www.womenshealth.gov/ or https://www.womenshealth.gov/a-z-topics/bacterial-vaginosis Contact a health care provider if:  Your symptoms do not improve, even after treatment.  You have more discharge or pain when urinating.  You have a fever.  You have pain in your abdomen.  You have pain during sex.  You have vaginal bleeding between periods. Summary  Bacterial vaginosis is a vaginal infection that occurs when the normal balance of bacteria in the vagina is disrupted.  Because bacterial vaginosis increases your risk for STIs (sexually transmitted infections), getting treated can help reduce your risk for chlamydia, gonorrhea, herpes, and HIV (human immunodeficiency virus). Treatment is also important for preventing complications in pregnant women, because the condition can cause an early (premature) delivery.  This condition is treated with antibiotic medicines. These may be given as a pill, a vaginal cream, or a medicine that is put into the vagina (suppository). This information is not intended to replace advice given to you by your health care provider. Make sure you discuss any questions you have with your health care provider. Document Released: 05/14/2005 Document Revised: 09/17/2016 Document Reviewed: 01/28/2016 Elsevier Interactive Patient Education  2018 Elsevier Inc.  

## 2017-07-01 NOTE — Progress Notes (Signed)
Subjective:     Vanessa Nelson is a 22 y.o. female here for a routine exam.  Current complaints: pt c/o pain for 8 months. She was eval for appy which ruled out. Pt with PID in 2015 and it feels the same. Pt was treated with 3 atbx for 'HPV' Pt was given Doxycycline and Flagyl.   Her STI panel was neg. Pt reports discharge that is white and clumpy but, it started after the atbc. No dischareg prior to the atbx.  Pt reports that the pain is still present.  The pt reports that the cycles have been irreg and the pain is worse if she does NOT have a cycles while she is waiting on her cycle.   Menarche was at 14 years pt reports migraines and pain in her lower abd. That pain stopped with starting Junel 24 Fe.  Currently pt reports no menses this month .    Pt is currently sexually active. Same partner for 6 months   Gynecologic History Patient's last menstrual period was 05/16/2017 (within days). Contraception: OCP (estrogen/progesterone)- beginning her 3rd year.  Last Pap: never had. Pt did have HPV testing done at the urgent care center.    Obstetric History OB History  Gravida Para Term Preterm AB Living  0 0 0 0 0 0  SAB TAB Ectopic Multiple Live Births  0 0 0 0 0        The following portions of the patient's history were reviewed and updated as appropriate: allergies, current medications, past family history, past medical history, past social history, past surgical history and problem list.  Review of Systems Pertinent items are noted in HPI.    Objective:  BP 118/76   Pulse 78   Ht 5\' 1"  (1.549 m)   Wt 149 lb 4.8 oz (67.7 kg)   LMP 05/16/2017 (Within Days)   BMI 28.21 kg/m   General Appearance:    Alert, cooperative, no distress, appears stated age  Head:    Normocephalic, without obvious abnormality, atraumatic  Eyes:    conjunctiva/corneas clear, EOM's intact, both eyes  Ears:    Normal external ear canals, both ears  Nose:   Nares normal, septum midline, mucosa normal, no  drainage    or sinus tenderness  Throat:   Lips, mucosa, and tongue normal; teeth and gums normal  Neck:   Supple, symmetrical, trachea midline, no adenopathy;    thyroid:  no enlargement/tenderness/nodules  Back:     Symmetric, no curvature, ROM normal, no CVA tenderness  Lungs:     Clear to auscultation bilaterally, respirations unlabored  Chest Wall:    No tenderness or deformity   Heart:    Regular rate and rhythm, S1 and S2 normal, no murmur, rub   or gallop  Breast Exam:    No tenderness, masses, or nipple abnormality  Abdomen:     Soft, non-tender, bowel sounds active all four quadrants,    no masses, no organomegaly  Genitalia:    Normal female without lesion, discharge or tenderness. NO CMT; occ nonreporducible adnexal tenderness     Extremities:   Extremities normal, atraumatic, no cyanosis or edema  Pulses:   2+ and symmetric all extremities  Skin:   Skin texture and turgor normal, multiple tattoos, no rashes or lesions     03/28/2017 COMPARISON:None. TECHNIQUE:Transabdominal and transvaginal pelvic ultrasound with color and spectral Doppler. INDICATION: pelvis pain.   FINDINGS:  UTERUS: - The uterus measures 6.4 x 2.4 x 4.5 cm. -  Fluid is noted in the cervix. Nabothian cyst present. - Endometrium measures 1 mm.  CUL-DE-SAC: - Small volume of free fluid.  OVARIES: - Right ovary measures 2.8 x 1.5 x 2.0 cm. - Left ovary measures 2.5, 0.2 0.5 cm. - No suspicious/abnormal solid ovarian masses. Multiple cysts/follicles are identified in bilateral ovaries which measure up to 7 mm right. - Arterial and venous flow is documented within bilateral ovaries and is normal. Symmetric resistive indices.     Other Result Information  Acute Interface, Incoming Rad Results - 03/28/2017  6:09 PM EDT COMPARISON:  None.   TECHNIQUE:  Transabdominal and transvaginal pelvic ultrasound with color and spectral Doppler. INDICATION: pelvis pain.   FINDINGS:  UTERUS: - The  uterus measures 6.4 x 2.4 x 4.5 cm. - Fluid is noted in the cervix. Nabothian cyst present. - Endometrium measures 1 mm.  CUL-DE-SAC: - Small volume of free fluid.  OVARIES: - Right ovary measures 2.8 x 1.5 x 2.0 cm. - Left ovary measures 2.5, 0.2 0.5 cm. - No suspicious/abnormal solid ovarian masses. Multiple cysts/follicles are identified in bilateral ovaries which measure up to 7 mm right. - Arterial and venous flow is documented within bilateral ovaries and is normal. Symmetric resistive indices.    IMPRESSION:            No acute sonographic findings.      UPT: neg Assessment:    Healthy female exam.   Had BV took flagyl fo r5 tab but, stopped due to rash on her arms and neck.   Levick pain Irreg menses on OCPs    Plan:    Will change OCPs from a to a pil    F/u 3 1/2 months  F/u PAP and wet mount  Kowen Kluth L. Harraway-Smith, M.D., [place]

## 2017-07-02 ENCOUNTER — Other Ambulatory Visit: Payer: Self-pay | Admitting: Obstetrics & Gynecology

## 2017-07-02 DIAGNOSIS — B3731 Acute candidiasis of vulva and vagina: Secondary | ICD-10-CM

## 2017-07-02 DIAGNOSIS — B373 Candidiasis of vulva and vagina: Secondary | ICD-10-CM

## 2017-07-02 LAB — CERVICOVAGINAL ANCILLARY ONLY
Bacterial vaginitis: NEGATIVE
CHLAMYDIA, DNA PROBE: NEGATIVE
Candida vaginitis: POSITIVE — AB
Neisseria Gonorrhea: NEGATIVE
Trichomonas: NEGATIVE

## 2017-07-02 LAB — CYTOLOGY - PAP: Diagnosis: NEGATIVE

## 2017-07-02 MED ORDER — FLUCONAZOLE 150 MG PO TABS: 150.0000 mg | ORAL_TABLET | Freq: Once | ORAL | 0 refills | Status: AC

## 2017-07-04 ENCOUNTER — Encounter: Payer: Self-pay | Admitting: Obstetrics and Gynecology

## 2017-07-05 ENCOUNTER — Telehealth: Payer: Self-pay | Admitting: *Deleted

## 2017-07-05 NOTE — Telephone Encounter (Signed)
Pt called requesting results for her pap and wet prep. Informed her all normal except positive yeast. Pt states Dr. Luretha MurphyHS already sent rx to her pharmacy. No further questions or concerns.

## 2017-07-09 ENCOUNTER — Telehealth: Payer: Self-pay

## 2017-07-09 NOTE — Telephone Encounter (Signed)
Pt called the office stating that she is a pt of Dr. Danne HarborHarraway-Smith's. Pt states that she is having break through bleeding and pain. Pt states that she has taken Midol and Naproxen. Please return call.

## 2017-07-16 ENCOUNTER — Encounter: Payer: Self-pay | Admitting: *Deleted

## 2017-07-16 DIAGNOSIS — R102 Pelvic and perineal pain: Secondary | ICD-10-CM

## 2017-07-16 NOTE — Telephone Encounter (Signed)
Response sent via MyChart

## 2017-07-17 MED ORDER — KETOROLAC TROMETHAMINE 10 MG PO TABS
10.0000 mg | ORAL_TABLET | Freq: Four times a day (QID) | ORAL | 0 refills | Status: DC | PRN
Start: 2017-07-17 — End: 2018-07-25

## 2017-09-24 IMAGING — US US PELVIS COMPLETE
1 series · 14 of 25 positions shown · non-contrast
Comparison: None.

CLINICAL DATA: Right adnexal pain x2 months

EXAM:
TRANSABDOMINAL AND TRANSVAGINAL ULTRASOUND OF PELVIS
DOPPLER ULTRASOUND OF OVARIES
TECHNIQUE: Both transabdominal and transvaginal ultrasound examinations of the
pelvis were performed. Transabdominal technique was performed for
global imaging of the pelvis including uterus, ovaries, adnexal
regions, and pelvic cul-de-sac.
It was necessary to proceed with endovaginal exam following the
transabdominal exam to visualize the endometrium and bilateral
ovaries. Color and duplex Doppler ultrasound was utilized to
evaluate blood flow to the ovaries.

[Series 1: us pelvis complete · 0.21mm/px · 14 of 81 slices shown]
[im 1/81]
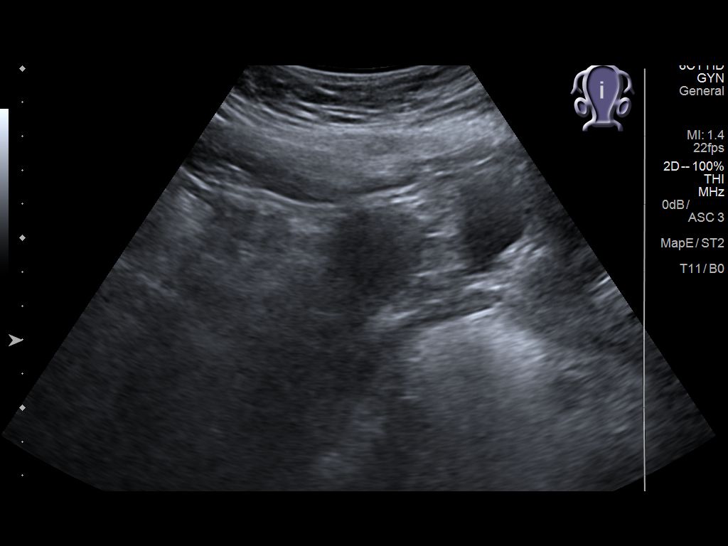
[im 7/81]
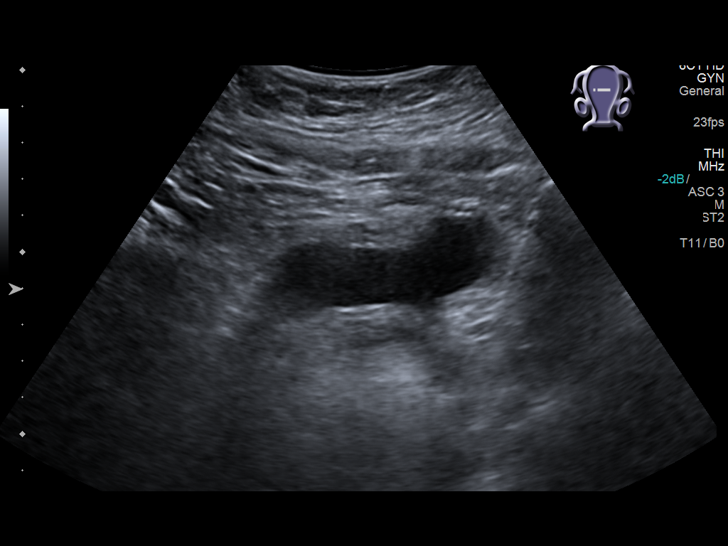
[im 14/81]
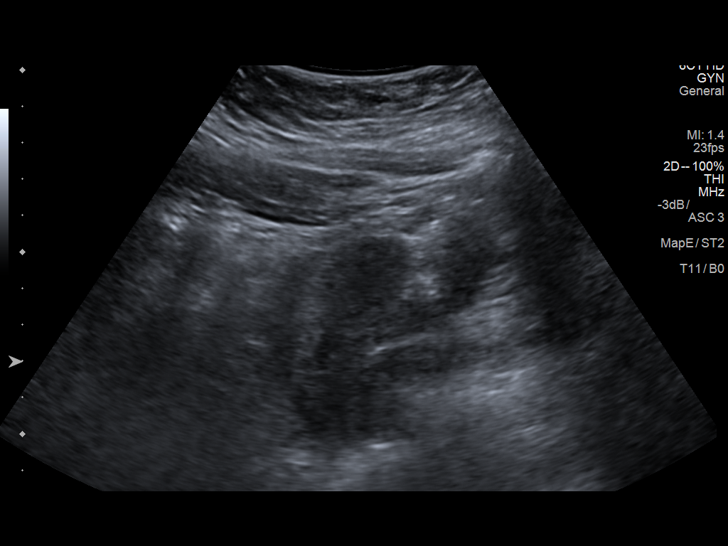
[im 21/81]
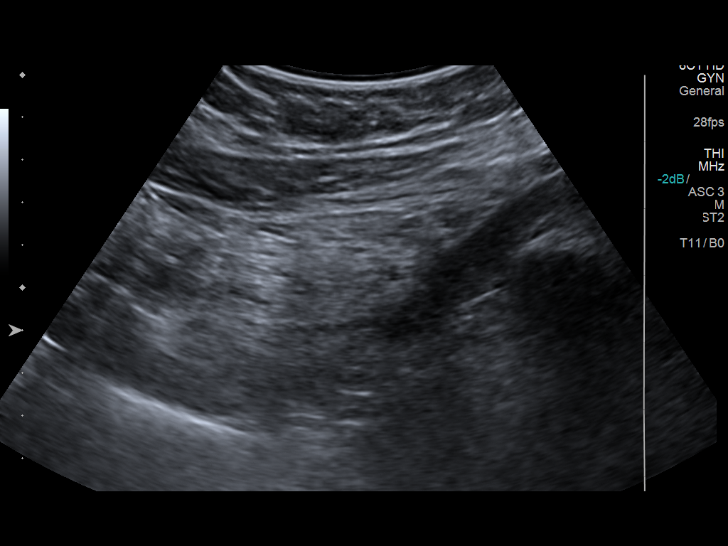
[im 27/81]
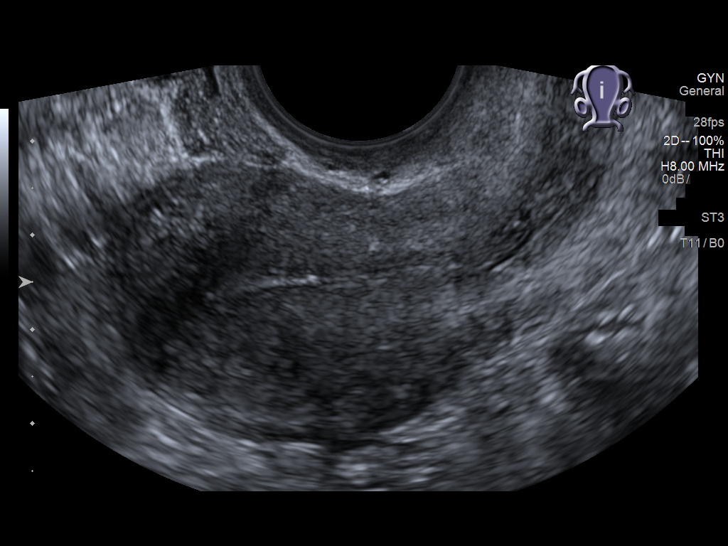
[im 31/81]
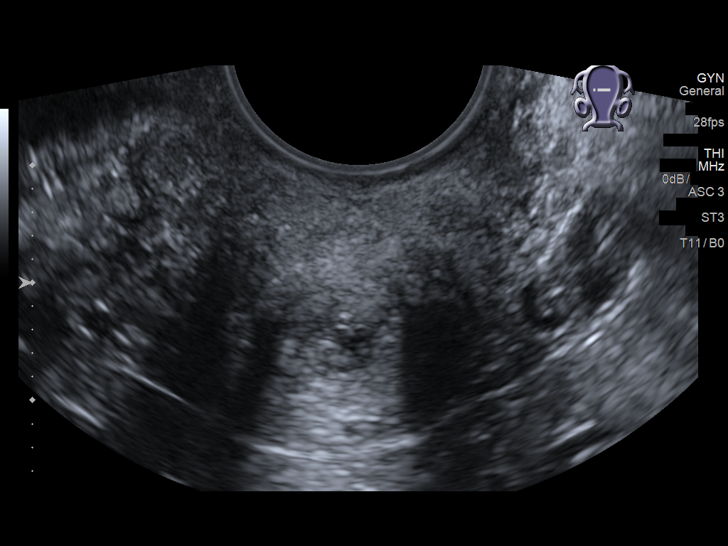
[im 37/81]
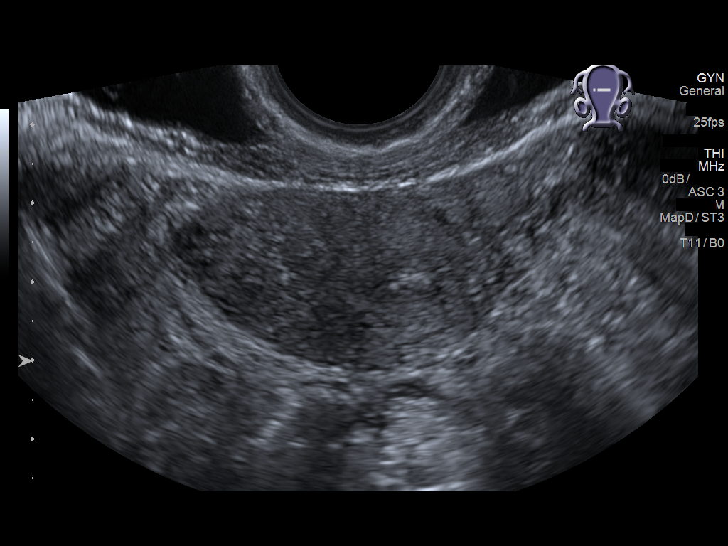
[im 44/81]
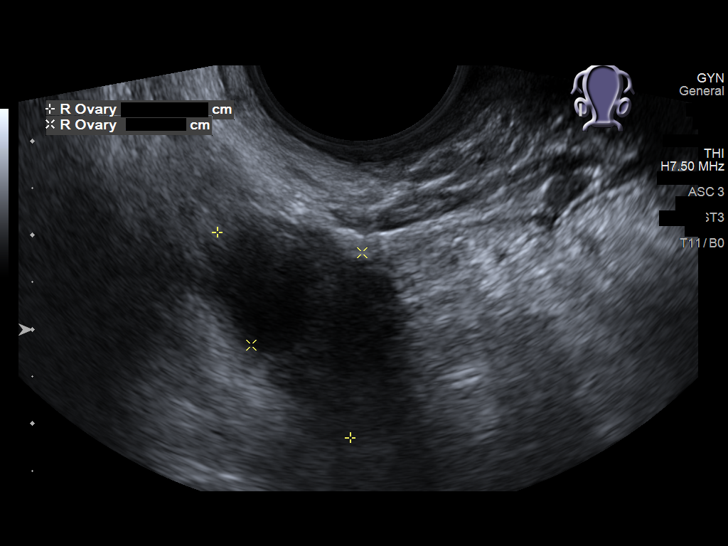
[im 51/81]
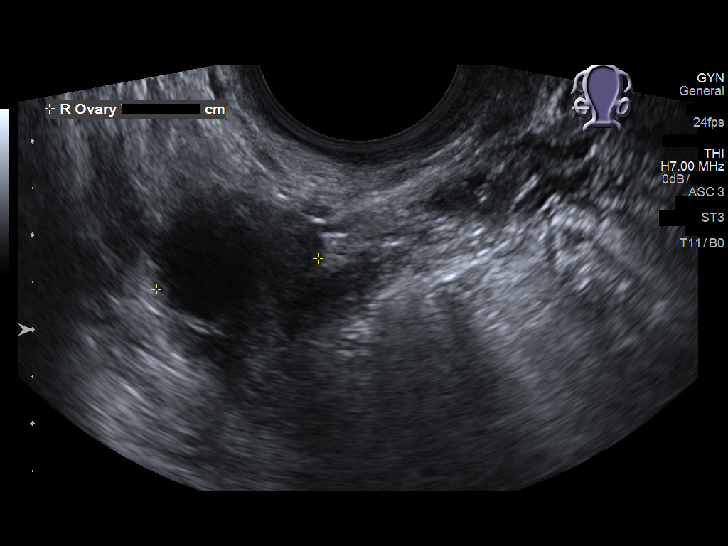
[im 54/81]
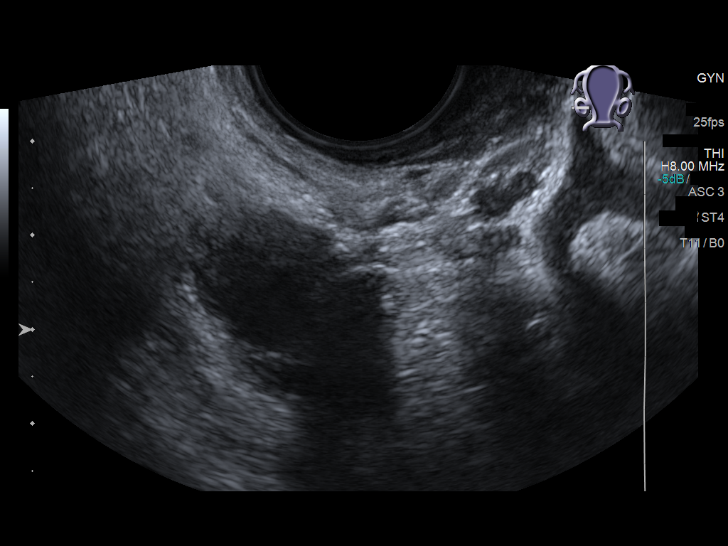
[im 61/81]
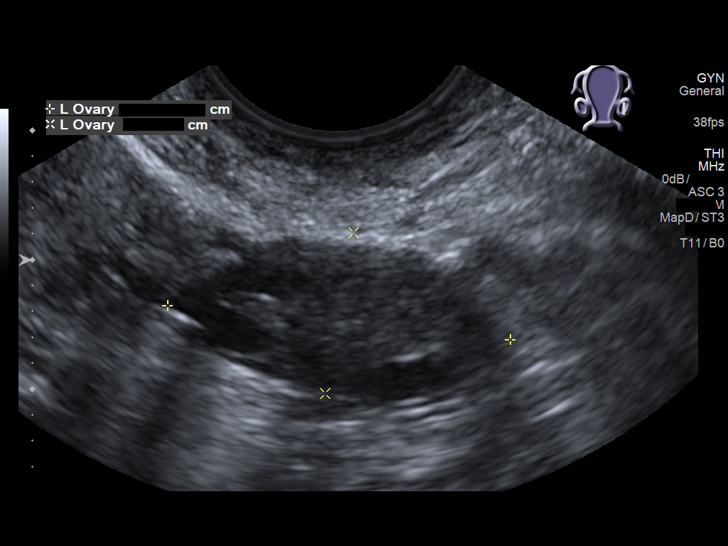
[im 67/81]
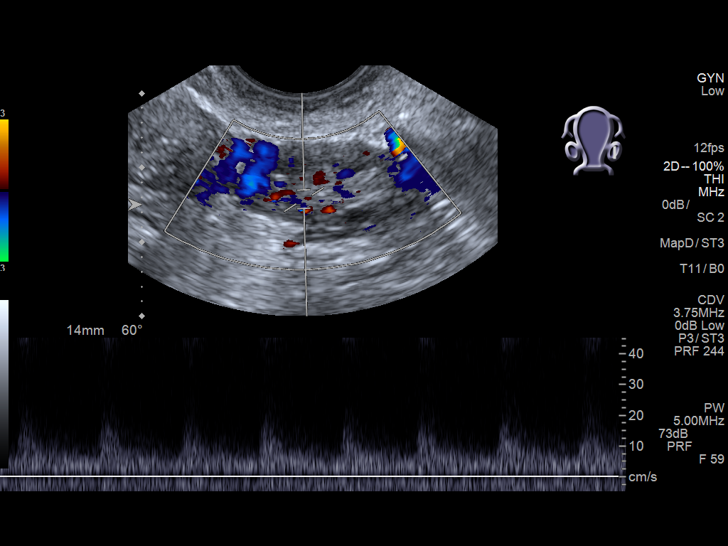
[im 74/81]
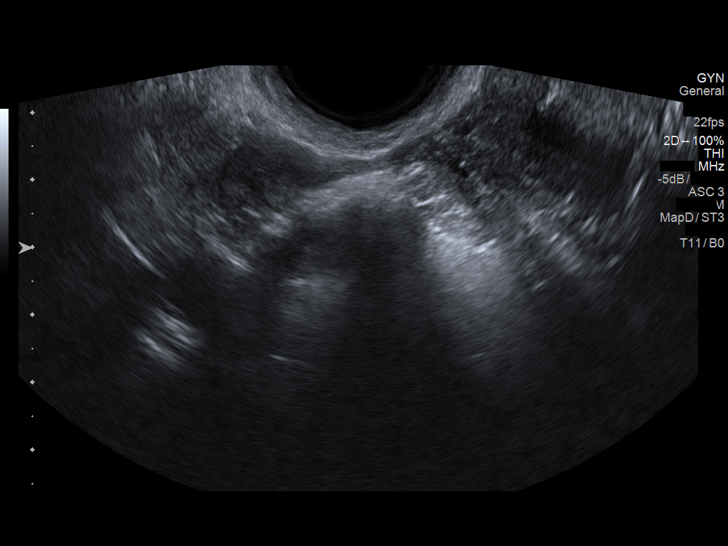
[im 81/81]
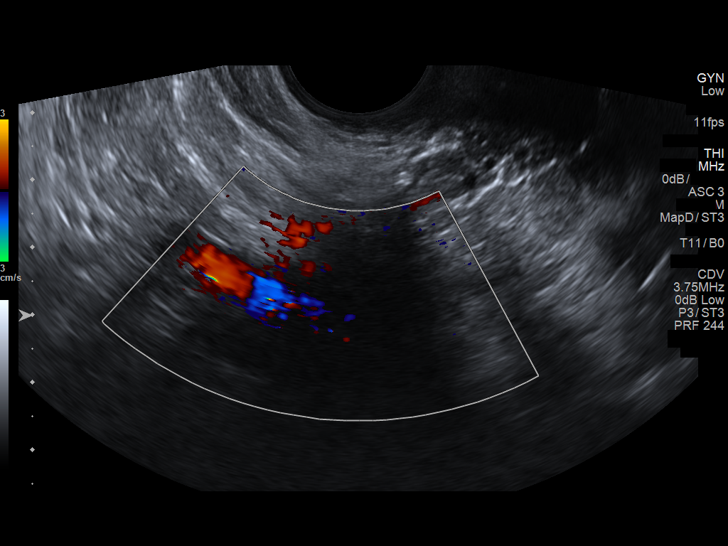

[14 of 25 positions shown; findings below may reference images not displayed]

FINDINGS: Uterus

Measurements: 6.2 x 3.5 x 3.8 cm. No fibroids or other mass
visualized.

Endometrium

Thickness: 3 mm.  No focal abnormality visualized.

Right ovary

Measurements: 2.6 x 1.5 x 1.8 cm. Normal appearance/no adnexal mass.

Left ovary

Measurements: 2.7 x 1.3 x 2.1 cm. Normal appearance/no adnexal mass.

Pulsed Doppler evaluation of both ovaries demonstrates normal
low-resistance arterial and venous waveforms.

Other findings

No abnormal free fluid.
IMPRESSION: Negative pelvic ultrasound.

No evidence of ovarian torsion.

## 2017-10-15 ENCOUNTER — Ambulatory Visit (HOSPITAL_COMMUNITY): Payer: Self-pay | Admitting: Psychiatry

## 2017-11-04 ENCOUNTER — Encounter (HOSPITAL_COMMUNITY): Payer: Self-pay | Admitting: Psychology

## 2017-11-04 NOTE — Progress Notes (Signed)
Vanessa Nelson is a 22 y.o. female patient who is discharge from counseling as didn't return for tx.  Outpatient Therapist Discharge Summary  Vanessa PonsSarah Sand    [birth year]   Admission Date: 01/17/17   Discharge Date:  11/04/17 Reason for Discharge:  Didn't return Diagnosis:  Bipolar 1 d/o Comments:  Pt had 3 no shows and cancelled multiple f/u  Alfredo BattyLeanne M Charise Leinbach          Jerlisa Diliberto, [facility]

## 2017-12-02 ENCOUNTER — Encounter: Payer: Self-pay | Admitting: Obstetrics & Gynecology

## 2018-01-06 ENCOUNTER — Ambulatory Visit: Payer: Self-pay | Admitting: Obstetrics & Gynecology

## 2018-06-09 ENCOUNTER — Ambulatory Visit: Payer: Self-pay | Admitting: Obstetrics & Gynecology

## 2018-06-17 ENCOUNTER — Other Ambulatory Visit: Payer: Self-pay

## 2018-06-17 ENCOUNTER — Encounter (HOSPITAL_COMMUNITY): Payer: Self-pay | Admitting: Emergency Medicine

## 2018-06-17 ENCOUNTER — Emergency Department (HOSPITAL_COMMUNITY)
Admission: EM | Admit: 2018-06-17 | Discharge: 2018-06-18 | Disposition: A | Discharge status: Home / Self Care | Payer: 59 | Attending: Emergency Medicine | Admitting: Emergency Medicine

## 2018-06-17 ENCOUNTER — Emergency Department (HOSPITAL_COMMUNITY): Payer: 59

## 2018-06-17 DIAGNOSIS — M25512 Pain in left shoulder: Secondary | ICD-10-CM | POA: Insufficient documentation

## 2018-06-17 DIAGNOSIS — Z5321 Procedure and treatment not carried out due to patient leaving prior to being seen by health care provider: Secondary | ICD-10-CM | POA: Diagnosis not present

## 2018-06-17 DIAGNOSIS — R05 Cough: Secondary | ICD-10-CM | POA: Insufficient documentation

## 2018-06-17 DIAGNOSIS — R079 Chest pain, unspecified: Secondary | ICD-10-CM | POA: Diagnosis present

## 2018-06-17 LAB — CBC
HCT: 47.7 % — ABNORMAL HIGH (ref 36.0–46.0)
Hemoglobin: 15.6 g/dL — ABNORMAL HIGH (ref 12.0–15.0)
MCH: 29.4 pg (ref 26.0–34.0)
MCHC: 32.7 g/dL (ref 30.0–36.0)
MCV: 90 fL (ref 80.0–100.0)
Platelets: 330 10*3/uL (ref 150–400)
RBC: 5.3 MIL/uL — ABNORMAL HIGH (ref 3.87–5.11)
RDW: 12 % (ref 11.5–15.5)
WBC: 6.2 10*3/uL (ref 4.0–10.5)
nRBC: 0 % (ref 0.0–0.2)

## 2018-06-17 LAB — BASIC METABOLIC PANEL
Anion gap: 11 (ref 5–15)
BUN: 8 mg/dL (ref 6–20)
CALCIUM: 9.5 mg/dL (ref 8.9–10.3)
CO2: 23 mmol/L (ref 22–32)
CREATININE: 0.75 mg/dL (ref 0.44–1.00)
Chloride: 103 mmol/L (ref 98–111)
GFR calc Af Amer: >60 mL/min (ref >60)
Glucose, Bld: 86 mg/dL (ref 70–99)
Potassium: 3.4 mmol/L — ABNORMAL LOW (ref 3.5–5.1)
Sodium: 137 mmol/L (ref 135–145)

## 2018-06-17 LAB — I-STAT TROPONIN, ED: TROPONIN I, POC: 0 ng/mL (ref 0.00–0.08)

## 2018-06-17 LAB — I-STAT BETA HCG BLOOD, ED (MC, WL, AP ONLY): I-stat hCG, quantitative: <5 m[IU]/mL (ref <5)

## 2018-06-17 IMAGING — DX DG CHEST 2V
2 series · 2 of 2 positions shown · non-contrast
Comparison: None.

CLINICAL DATA: Left-sided chest pain and dyspnea x2 days.

EXAM:
CHEST - 2 VIEW

[chest pa]
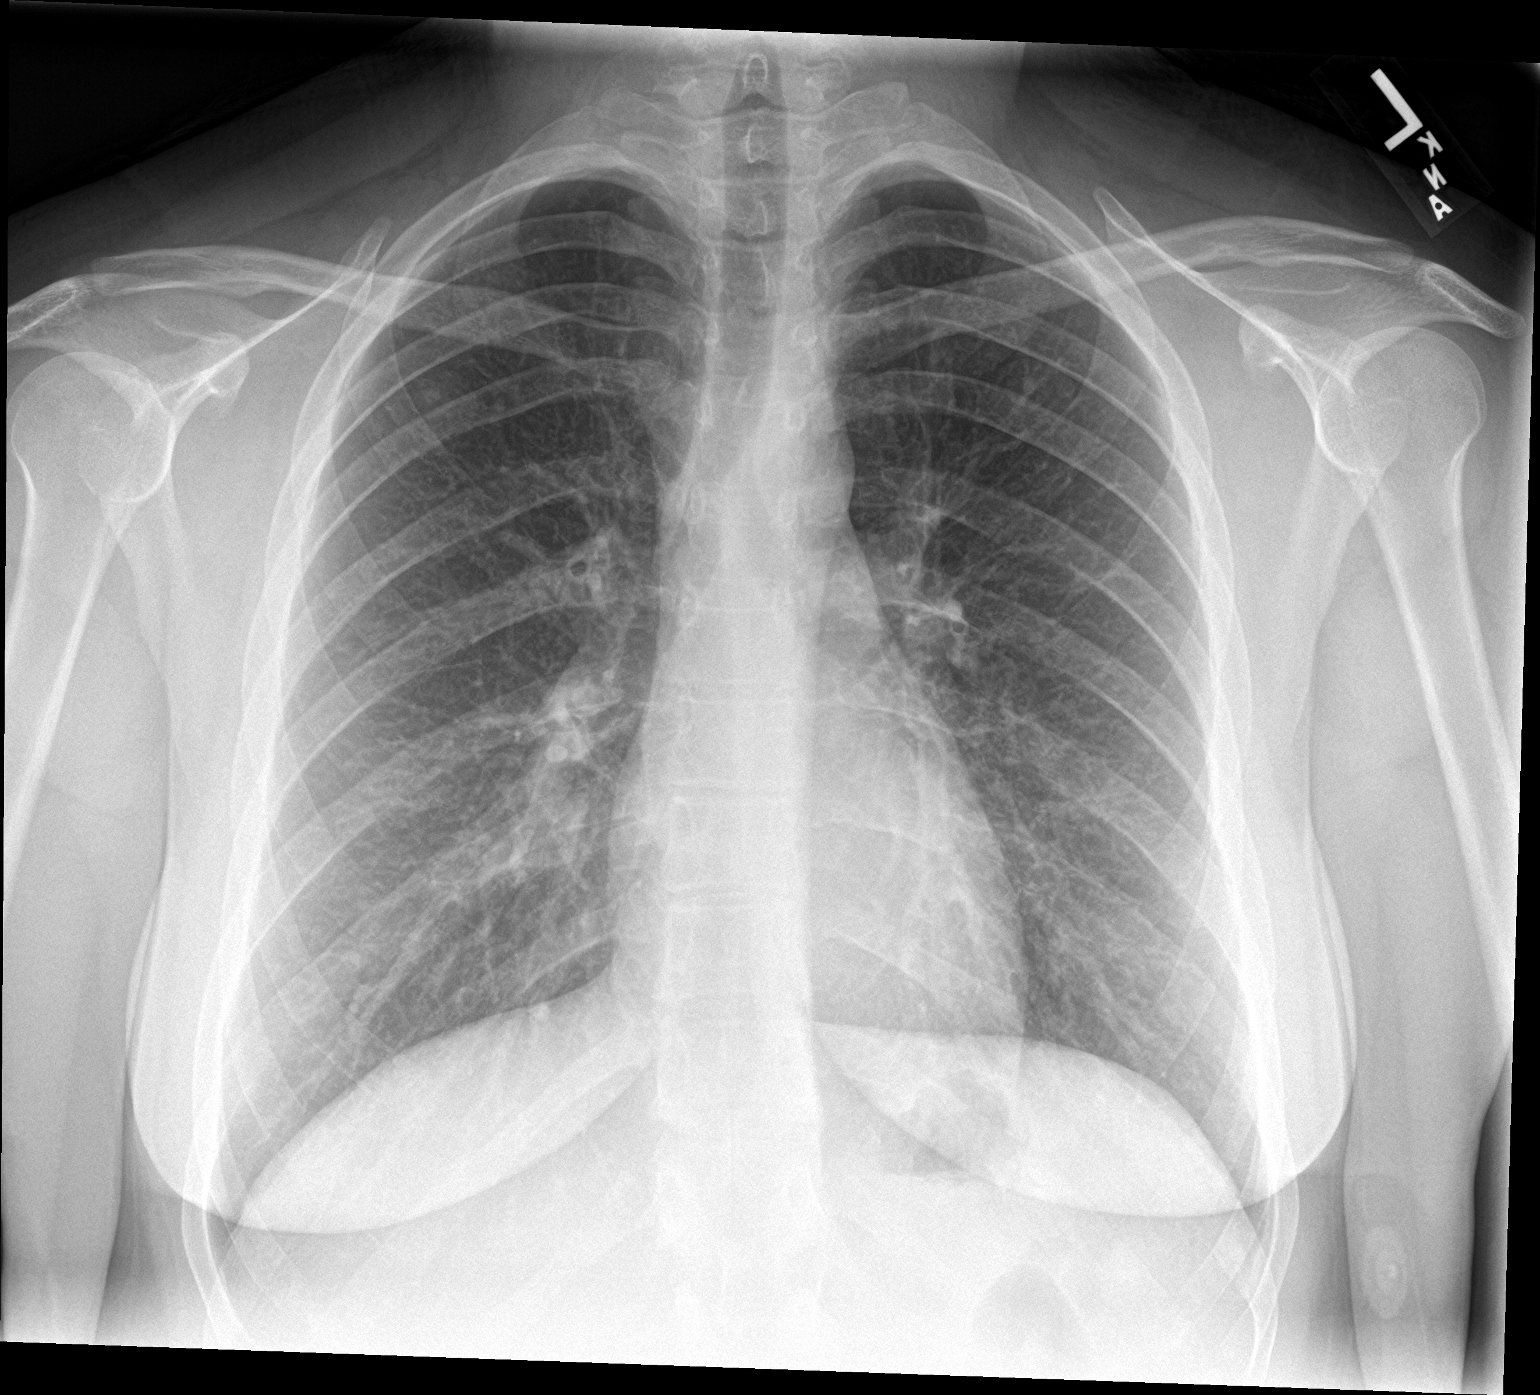

[chest lat]
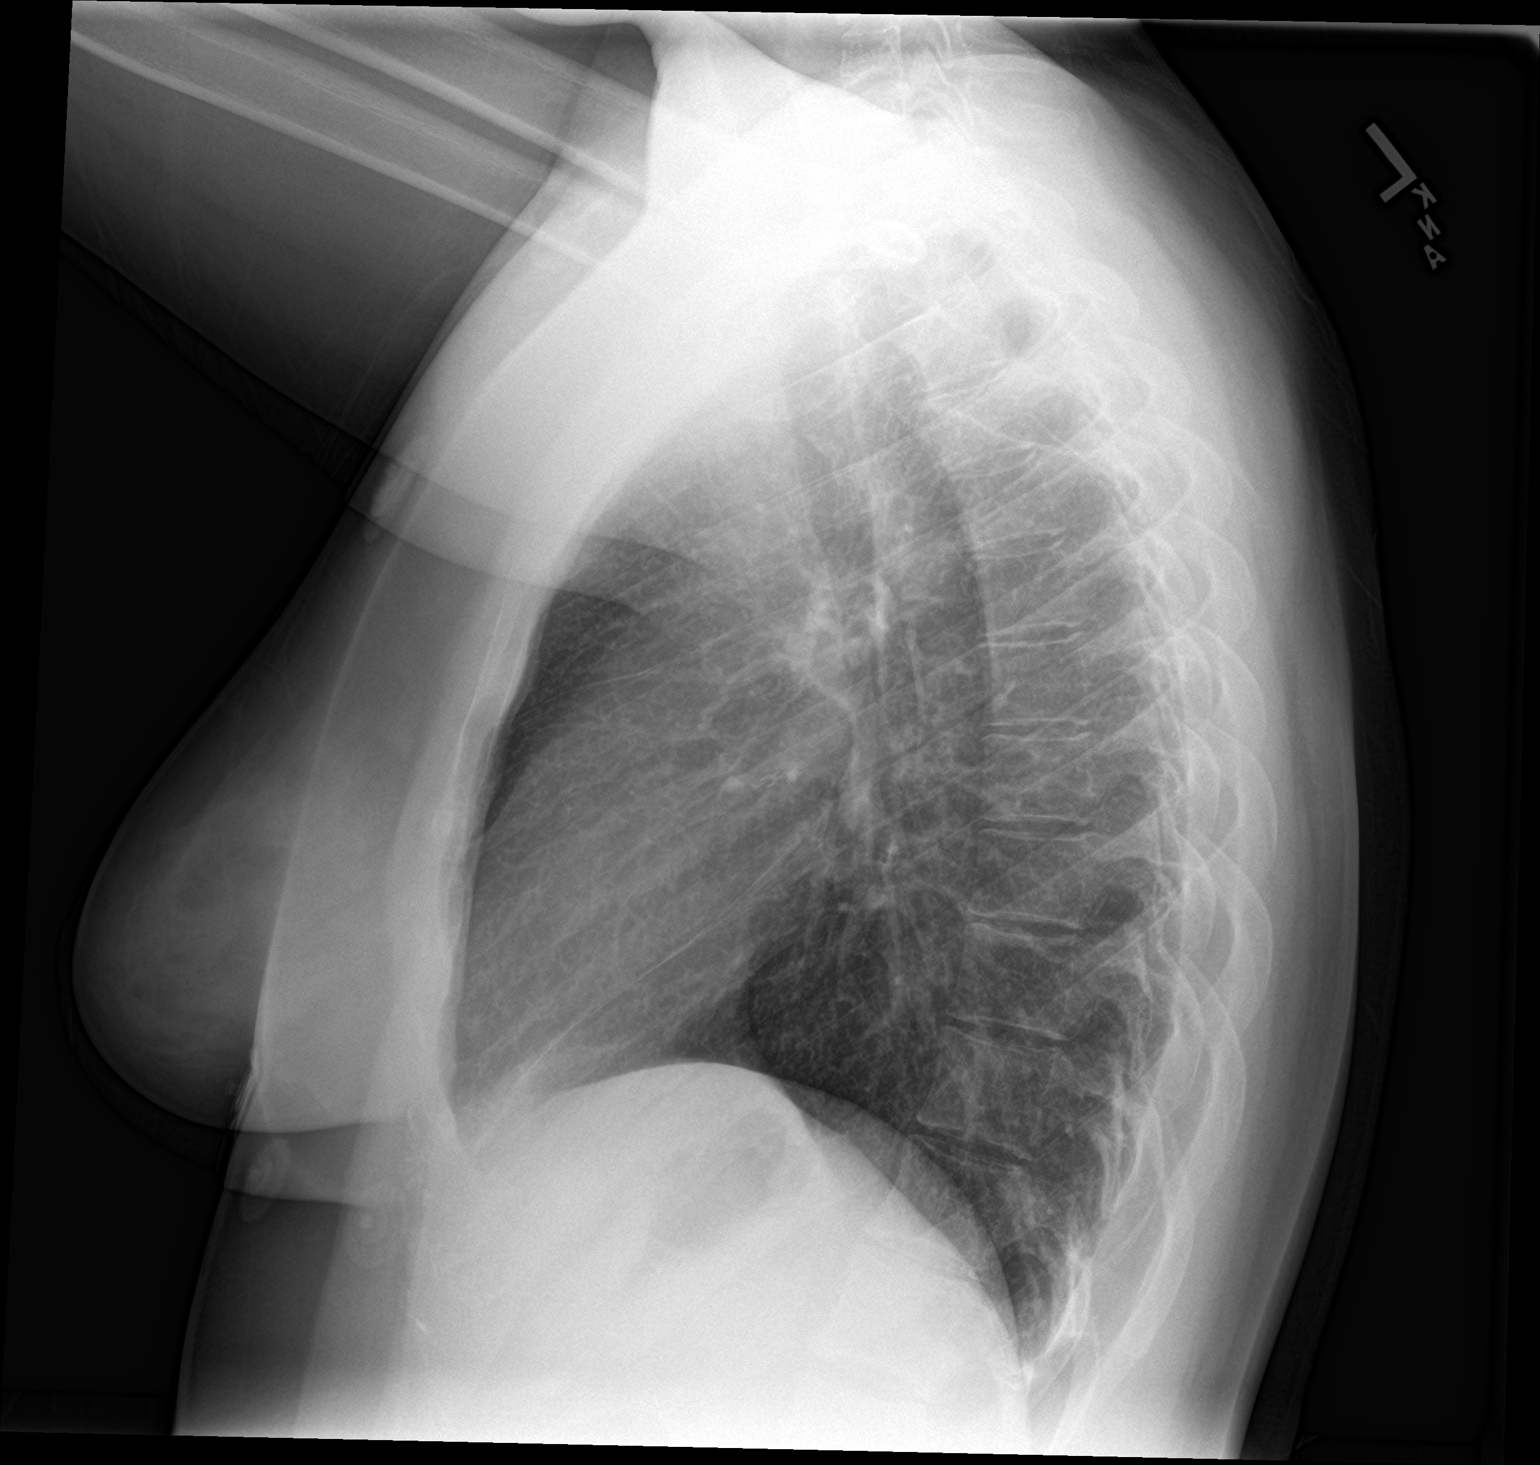

[2 of 2 positions shown; findings below may reference images not displayed]

FINDINGS: The heart size and mediastinal contours are within normal limits.
Both lungs are clear. The visualized skeletal structures are
unremarkable.
IMPRESSION: No active cardiopulmonary disease.

## 2018-06-17 MED ORDER — SODIUM CHLORIDE 0.9% FLUSH: 3.0000 mL | Freq: Once | INTRAVENOUS | Status: DC

## 2018-06-17 NOTE — ED Triage Notes (Signed)
Pt reports being treated recently for sinus infection and has been taking a zpack. Pt reports chest pain on the left side with radiation to the shoulder worsening with coughing.

## 2018-07-25 ENCOUNTER — Emergency Department (HOSPITAL_COMMUNITY)
Admission: EM | Admit: 2018-07-25 | Discharge: 2018-07-25 | Disposition: A | Discharge status: Home / Self Care | Payer: 59 | Attending: Emergency Medicine | Admitting: Emergency Medicine

## 2018-07-25 ENCOUNTER — Other Ambulatory Visit: Payer: Self-pay

## 2018-07-25 DIAGNOSIS — Z91040 Latex allergy status: Secondary | ICD-10-CM | POA: Insufficient documentation

## 2018-07-25 DIAGNOSIS — F319 Bipolar disorder, unspecified: Secondary | ICD-10-CM | POA: Diagnosis not present

## 2018-07-25 DIAGNOSIS — Z79899 Other long term (current) drug therapy: Secondary | ICD-10-CM | POA: Insufficient documentation

## 2018-07-25 DIAGNOSIS — F419 Anxiety disorder, unspecified: Secondary | ICD-10-CM | POA: Insufficient documentation

## 2018-07-25 DIAGNOSIS — R1110 Vomiting, unspecified: Secondary | ICD-10-CM | POA: Diagnosis present

## 2018-07-25 DIAGNOSIS — R112 Nausea with vomiting, unspecified: Secondary | ICD-10-CM

## 2018-07-25 LAB — URINALYSIS, ROUTINE W REFLEX MICROSCOPIC
Bilirubin Urine: NEGATIVE
Glucose, UA: NEGATIVE mg/dL
Hgb urine dipstick: NEGATIVE
KETONES UR: 5 mg/dL — AB
Nitrite: NEGATIVE
Protein, ur: NEGATIVE mg/dL
Specific Gravity, Urine: 1.018 (ref 1.005–1.030)
pH: 5 (ref 5.0–8.0)

## 2018-07-25 LAB — CBC
HCT: 45.1 % (ref 36.0–46.0)
Hemoglobin: 15.5 g/dL — ABNORMAL HIGH (ref 12.0–15.0)
MCH: 30.2 pg (ref 26.0–34.0)
MCHC: 34.4 g/dL (ref 30.0–36.0)
MCV: 87.7 fL (ref 80.0–100.0)
Platelets: 341 10*3/uL (ref 150–400)
RBC: 5.14 MIL/uL — ABNORMAL HIGH (ref 3.87–5.11)
RDW: 12.3 % (ref 11.5–15.5)
WBC: 8.9 10*3/uL (ref 4.0–10.5)
nRBC: 0 % (ref 0.0–0.2)

## 2018-07-25 LAB — COMPREHENSIVE METABOLIC PANEL
ALT: 20 U/L (ref 0–44)
AST: 23 U/L (ref 15–41)
Albumin: 4.7 g/dL (ref 3.5–5.0)
Alkaline Phosphatase: 92 U/L (ref 38–126)
Anion gap: 13 (ref 5–15)
BILIRUBIN TOTAL: 0.6 mg/dL (ref 0.3–1.2)
BUN: 12 mg/dL (ref 6–20)
CO2: 18 mmol/L — ABNORMAL LOW (ref 22–32)
Calcium: 9.3 mg/dL (ref 8.9–10.3)
Chloride: 110 mmol/L (ref 98–111)
Creatinine, Ser: 0.72 mg/dL (ref 0.44–1.00)
GFR calc Af Amer: >60 mL/min (ref >60)
GFR calc non Af Amer: >60 mL/min (ref >60)
Glucose, Bld: 100 mg/dL — ABNORMAL HIGH (ref 70–99)
Potassium: 3.8 mmol/L (ref 3.5–5.1)
Sodium: 141 mmol/L (ref 135–145)
TOTAL PROTEIN: 8.9 g/dL — AB (ref 6.5–8.1)

## 2018-07-25 LAB — TYPE AND SCREEN
ABO/RH(D): O POS
Antibody Screen: NEGATIVE

## 2018-07-25 LAB — ABO/RH: ABO/RH(D): O POS

## 2018-07-25 LAB — I-STAT BETA HCG BLOOD, ED (MC, WL, AP ONLY): I-stat hCG, quantitative: <5 m[IU]/mL (ref <5)

## 2018-07-25 LAB — LIPASE, BLOOD: Lipase: 27 U/L (ref 11–51)

## 2018-07-25 MED ORDER — SODIUM CHLORIDE 0.9 % IV BOLUS
1000.0000 mL | Freq: Once | INTRAVENOUS | Status: AC
  Administered 2018-07-25: 1000 mL via INTRAVENOUS

## 2018-07-25 MED ORDER — OMEPRAZOLE 20 MG PO CPDR: 20.0000 mg | DELAYED_RELEASE_CAPSULE | Freq: Every day | ORAL | 0 refills | Status: DC

## 2018-07-25 MED ORDER — ONDANSETRON HCL 4 MG/2ML IJ SOLN
4.0000 mg | Freq: Once | INTRAMUSCULAR | Status: AC | PRN
  Administered 2018-07-25: 4 mg via INTRAVENOUS
  Filled 2018-07-25: qty 2

## 2018-07-25 MED ORDER — ONDANSETRON 8 MG PO TBDP: 8.0000 mg | ORAL_TABLET | Freq: Three times a day (TID) | ORAL | 0 refills | Status: DC | PRN

## 2018-07-25 NOTE — ED Notes (Signed)
UA sample sitting @ bedside

## 2018-07-25 NOTE — ED Triage Notes (Addendum)
Pt from home, reports starting vomiting since Wednesday, got slightly better, then began getting worse again Thursday.  Pt reports hematemesis starting around 0300 today, starting bright red at first, progressing to dark brown.  Pt also reports "passing out" at least twice where she has woken up on her floor. Denies hitting head, denies taking blood thinners.  Pt also reports having 1 episode diarrhea in last 24h, no blood.

## 2018-07-25 NOTE — Discharge Instructions (Signed)
Please call your gastroenterologist for follow up °

## 2018-07-25 NOTE — ED Provider Notes (Signed)
[facility]-EMERGENCY DEPT Provider Note   CSN: [id number] Arrival date & time: 07/25/18  0730    History   Chief Complaint Chief Complaint  Patient presents with  . Hematemesis    HPI Vanessa Nelson is a 23 y.o. female.     HPI Patient is a 23 year old female presents the emergency department vomiting for 2 days.  She has a history of IBS.  She did have diarrhea.  The diarrhea is resolved.  She initially began with some specks of blood and now has had more blood over her past several episodes of vomiting.  No clots.  She reports some lightheadedness and possible syncopal episode.  No blood in her stool.  No melena described.  Last episode of vomiting was approximately 1 hour ago.  No vomiting thus far in the ER.  Denies heavy NSAID use.  History of IBS.  No PPI   Past Medical History:  Diagnosis Date  . Anxiety   . Fracture of upper end of left tibia 07/06/2014   fell while skateboarding  . Insomnia   . Mental disorder    bipolar  . Migraines     Patient Active Problem List   Diagnosis Date Noted  . Pain, female pelvic 07/01/2017  . Bipolar 1 disorder, depressed, mild (HCC) 10/04/2016    Class: Chronic  . 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 CHONDROPLASTY Left 07/09/2014   Procedure: CHONDROPLASTY;  Surgeon: Sheral Apleyimothy D Murphy, MD;  Location: [facility];  Service: Orthopedics;  Laterality: Left;  . DENTAL SURGERY     for impacted teeth  . KNEE ARTHROSCOPY Left 07/09/2014   Procedure: ARTHROSCOPY KNEE;  Surgeon: Sheral Apleyimothy D Murphy, MD;  Location: [facility];  Service: Orthopedics;  Laterality: Left;  . ORIF TIBIA PLATEAU Left 07/09/2014   Procedure: OPEN REDUCTION INTERNAL FIXATION (ORIF) TIBIAL PLATEAU;  Surgeon: Sheral Apleyimothy D Murphy, MD;  Location: [facility];  Service: Orthopedics;  Laterality: Left;     OB  History    Gravida  0   Para  0   Term  0   Preterm  0   AB  0   Living  0     SAB  0   TAB  0   Ectopic  0   Multiple  0   Live Births  0            Home Medications    Prior to Admission medications   Medication Sig Start Date End Date Taking? Authorizing Provider  dicyclomine (BENTYL) 20 MG tablet Take 20 mg by mouth 3 (three) times daily as needed for spasms.  07/30/17 07/30/18 Yes [provider]  Ibuprofen-Famotidine (DUEXIS) 800-26.6 MG TABS Take 1 tablet by mouth every 8 (eight) hours as needed (pain).   Yes [provider]  norgestimate-ethinyl estradiol (ORTHO-CYCLEN,SPRINTEC,PREVIFEM) 0.25-35 MG-MCG tablet Take 1 tablet by mouth daily. 07/01/17  Yes Willodean RosenthalHarraway-Smith, Carolyn, MD  promethazine (PHENERGAN) 12.5 MG tablet Take 12.5 mg by mouth every 6 (six) hours as needed for nausea or vomiting.  02/04/18  Yes [provider]  verapamil (VERELAN PM) 120 MG 24 hr capsule Take 120 mg by mouth at bedtime. 04/11/18  Yes [provider]  amitriptyline (ELAVIL) 50 MG tablet Take 1 tablet (50 mg total) by mouth at bedtime. Patient not taking: Reported on 03/09/2017 01/08/17   Arfeen, Phillips GroutSyed T, MD  cephALEXin (KEFLEX) 500 MG capsule Take 1 capsule (500 mg total) by mouth 2 (two) times daily. Patient not taking: Reported on 04/19/2017 03/09/17   Garlon Hatchet, PA-C  ketorolac (TORADOL) 10 MG tablet Take 1 tablet (10 mg total) by mouth every 6 (six) hours as needed for up to 20 doses for moderate pain or severe pain. Patient not taking: Reported on 07/25/2018 07/17/17   [place] Bing, MD  lamoTRIgine (LAMICTAL) 200 MG tablet Take 1 tablet (200 mg total) by mouth daily. Patient not taking: Reported on 04/19/2017 01/08/17   Cleotis Nipper, MD    Family History No family history on file.  Social History Social History   Tobacco Use  . Smoking status: Never Smoker  . Smokeless tobacco: Never Used  Substance Use Topics  . Alcohol use: No    . Drug use: No     Allergies   Oxycodone; Tamiflu [oseltamivir phosphate]; Latex; Wellbutrin [bupropion]; Flagyl [metronidazole]; and Nickel   Review of Systems Review of Systems  All other systems reviewed and are negative.    Physical Exam Updated Vital Signs BP 108/64   Pulse 72   Temp 97.7 F (36.5 C) (Oral)   Resp 16   Ht 5\' 1"  (1.549 m)   Wt 69.9 kg   LMP  (Within Months) Comment: "had two in January, none this month"  SpO2 99%   BMI 29.10 kg/m   Physical Exam Vitals signs and nursing note reviewed.  Constitutional:      General: She is not in acute distress.    Appearance: She is well-developed.  HENT:     Head: Normocephalic and atraumatic.  Neck:     Musculoskeletal: Normal range of motion.  Cardiovascular:     Rate and Rhythm: Normal rate and regular rhythm.     Heart sounds: Normal heart sounds.  Pulmonary:     Effort: Pulmonary effort is normal.     Breath sounds: Normal breath sounds.  Abdominal:     General: There is no distension.     Palpations: Abdomen is soft.     Tenderness: There is no abdominal tenderness.  Musculoskeletal: Normal range of motion.  Skin:    General: Skin is warm and dry.  Neurological:     Mental Status: She is alert and oriented to person, place, and time.  Psychiatric:        Judgment: Judgment normal.      ED Treatments / Results  Labs (all labs ordered are listed, but only abnormal results are displayed) Labs Reviewed  COMPREHENSIVE METABOLIC PANEL - Abnormal; Notable for the following components:      Result Value   CO2 18 (*)    Glucose, Bld 100 (*)    Total Protein 8.9 (*)    All other components within normal limits  CBC - Abnormal; Notable for the following components:   RBC 5.14 (*)    Hemoglobin 15.5 (*)    All other components within normal limits  URINALYSIS, ROUTINE W REFLEX MICROSCOPIC - Abnormal; Notable for the following components:   APPearance CLOUDY (*)    Ketones, ur 5 (*)     Leukocytes,Ua SMALL (*)    Bacteria, UA FEW (*)    All other components within normal limits  URINE CULTURE  LIPASE, BLOOD  I-STAT BETA HCG BLOOD, ED ([facility], [facility], [facility] ONLY)  TYPE AND SCREEN  ABO/RH    EKG None  Radiology No results found.  Procedures Procedures (including critical care time)  Medications Ordered in ED Medications  ondansetron (ZOFRAN) injection 4 mg (4 mg Intravenous Given 07/25/18 0804)  sodium chloride 0.9 % bolus 1,000 mL (1,000 mLs Intravenous New Bag/Given 07/25/18 0849)     Initial Impression / Assessment and Plan / ED Course  I have reviewed the triage vital signs and the nursing notes.  Pertinent labs & imaging results that were available during my care of the patient were reviewed by me and considered in my medical decision making (see chart for details).        No vomiting in the ER.  Patient feels much better at this time.  Hemoglobin stable.  Vital signs stable.  This may represent gastritis or vomiting with Mallory-Weiss tear.  Lower suspicion for bleeding ulcer given the stability in her hemoglobin and vital signs and her symptoms for several days.  She has an outpatient GI doctor.  She will be placed on Prilosec and Zofran.  She understands to return to the ER for new or worsening symptoms  Final Clinical Impressions(s) / ED Diagnoses   Final diagnoses:  None    ED Discharge Orders    None       Azalia Bilis, MD 07/25/18 [phone]

## 2018-07-26 LAB — URINE CULTURE: Culture: NO GROWTH

## 2018-08-05 ENCOUNTER — Telehealth: Payer: Self-pay

## 2018-08-05 NOTE — Telephone Encounter (Signed)
Sprintec be called into patient pharmacy.

## 2019-06-22 ENCOUNTER — Ambulatory Visit (HOSPITAL_COMMUNITY)
Admission: EM | Admit: 2019-06-22 | Discharge: 2019-06-22 | Disposition: A | Discharge status: Home / Self Care | Payer: 59 | Attending: Family Medicine | Admitting: Family Medicine

## 2019-06-22 ENCOUNTER — Encounter (HOSPITAL_COMMUNITY): Payer: Self-pay

## 2019-06-22 DIAGNOSIS — K5909 Other constipation: Secondary | ICD-10-CM | POA: Diagnosis not present

## 2019-06-22 DIAGNOSIS — J358 Other chronic diseases of tonsils and adenoids: Secondary | ICD-10-CM

## 2019-06-22 DIAGNOSIS — Z3202 Encounter for pregnancy test, result negative: Secondary | ICD-10-CM

## 2019-06-22 LAB — POCT URINALYSIS DIP (DEVICE)
Bilirubin Urine: NEGATIVE
Glucose, UA: NEGATIVE mg/dL
Hgb urine dipstick: NEGATIVE
Ketones, ur: NEGATIVE mg/dL
Nitrite: NEGATIVE
Protein, ur: NEGATIVE mg/dL
Specific Gravity, Urine: 1.015 (ref 1.005–1.030)
Urobilinogen, UA: 0.2 mg/dL (ref 0.0–1.0)
pH: 7.5 (ref 5.0–8.0)

## 2019-06-22 LAB — POCT PREGNANCY, URINE: Preg Test, Ur: NEGATIVE

## 2019-06-22 LAB — POC URINE PREG, ED: Preg Test, Ur: NEGATIVE

## 2019-06-22 NOTE — ED Triage Notes (Signed)
Pt presents to UC with constipation and vomiting episodes in the morning x 1 week; tonsil stones x 1 month. Pt reports started having lower abdominal pain this morning.

## 2019-06-22 NOTE — Discharge Instructions (Signed)
Tonsil stones are harmless and will likely recur  Take miralax 2 x a day until you have a BM then once a day until regularity is established Avoid laxatives Drink plenty of water

## 2019-06-22 NOTE — ED Provider Notes (Signed)
[facility]    CSN: [id number] Arrival date & time: 06/22/19  2683      History   Chief Complaint Chief Complaint  Patient presents with  . tonsil stones  . Constipation  . Emesis    HPI Vanessa Nelson is a 24 y.o. female.   HPI  Patient states that she has episodes of constipation and abdominal pain intermittently.  For the last week she has had some nausea and vomiting in the morning.  Does not think she is pregnant.  She states that she thinks is just the constipation, she has not had a normal bowel movement for a week. States she also has tonsil stones.  Those have been present for over a month.  She wants to know what to do to get rid of these and whether they are dangerous.  We discussed that they are benign, and will be present for many years probably until adulthood  Past Medical History:  Diagnosis Date  . Anxiety   . Fracture of upper end of left tibia 07/06/2014   fell while skateboarding  . Insomnia   . Mental disorder    bipolar  . Migraines     Patient Active Problem List   Diagnosis Date Noted  . Pain, female pelvic 07/01/2017  . Bipolar 1 disorder, depressed, mild (HCC) 10/04/2016    Class: Chronic  . 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 CHONDROPLASTY Left 07/09/2014   Procedure: CHONDROPLASTY;  Surgeon: Sheral Apley, MD;  Location: [facility];  Service: Orthopedics;  Laterality: Left;  . DENTAL SURGERY     for impacted teeth  . KNEE ARTHROSCOPY Left 07/09/2014   Procedure: ARTHROSCOPY KNEE;  Surgeon: Sheral Apley, MD;  Location: [facility];  Service: Orthopedics;  Laterality: Left;  . ORIF TIBIA PLATEAU Left 07/09/2014   Procedure: OPEN REDUCTION INTERNAL FIXATION (ORIF) TIBIAL PLATEAU;  Surgeon: Sheral Apley, MD;  Location: [facility];  Service: Orthopedics;  Laterality: Left;     OB History    Gravida  0   Para  0   Term  0   Preterm  0   AB  0   Living  0     SAB  0   TAB  0   Ectopic  0   Multiple  0   Live Births  0            Home Medications    Prior to Admission medications   Medication Sig Start Date End Date Taking? Authorizing Provider  dicyclomine (BENTYL) 20 MG tablet Take 20 mg by mouth 3 (three) times daily as needed for spasms.  07/30/17 07/30/18  [provider]  norgestimate-ethinyl estradiol (ORTHO-CYCLEN,SPRINTEC,PREVIFEM) 0.25-35 MG-MCG tablet Take 1 tablet by mouth daily. 07/01/17 06/22/19  Willodean Rosenthal, MD  omeprazole (PRILOSEC) 20 MG capsule Take 1 capsule (20 mg total) by mouth daily. 07/25/18 06/22/19  Azalia Bilis, MD  promethazine (PHENERGAN) 12.5 MG tablet Take 12.5 mg by mouth every 6 (six) hours as needed for nausea or vomiting.  02/04/18 06/22/19  [provider]  verapamil (VERELAN PM) 120 MG 24 hr capsule Take 120 mg by mouth at bedtime. 04/11/18 06/22/19  [provider]    Family History History reviewed. No pertinent family history.  Social History Social History   Tobacco Use  . Smoking status: Never Smoker  . Smokeless  tobacco: Never Used  Substance Use Topics  . Alcohol use: No  . Drug use: No     Allergies   Oxycodone, Tamiflu [oseltamivir phosphate], Latex, Wellbutrin [bupropion], Flagyl [metronidazole], and Nickel   Review of Systems Review of Systems  Constitutional: Negative for fatigue and fever.  HENT: Negative for congestion and sore throat.   Gastrointestinal: Positive for abdominal pain, constipation, diarrhea and vomiting.       Patient states history of irritable bowel     Physical Exam Triage Vital Signs ED Triage Vitals  Enc Vitals Group     BP 06/22/19 1053 123/83     Pulse Rate 06/22/19 1053 69     Resp 06/22/19 1053 17     Temp 06/22/19 1053 99 F (37.2 C)     Temp Source 06/22/19 1053 Oral     SpO2 06/22/19 1053 98 %      Weight --      Height --      Head Circumference --      Peak Flow --      Pain Score 06/22/19 1050 4     Pain Loc --      Pain Edu? --      Excl. in GC? --    No data found.  Updated Vital Signs BP 123/83 (BP Location: Right Arm)   Pulse 69   Temp 99 F (37.2 C) (Oral)   Resp 17   LMP  (Within Weeks) Comment: 2 weeks  SpO2 98%      Physical Exam Constitutional:      General: She is not in acute distress.    Appearance: She is well-developed and normal weight.  HENT:     Head: Normocephalic and atraumatic.     Mouth/Throat:     Mouth: Mucous membranes are moist.     Comments: No tonsil stones seen Eyes:     Conjunctiva/sclera: Conjunctivae normal.     Pupils: Pupils are equal, round, and reactive to light.  Cardiovascular:     Rate and Rhythm: Normal rate.  Pulmonary:     Effort: Pulmonary effort is normal. No respiratory distress.  Abdominal:     General: Abdomen is flat. There is no distension.     Palpations: Abdomen is soft.     Tenderness: There is no abdominal tenderness.  Musculoskeletal:        General: Normal range of motion.     Cervical back: Normal range of motion.  Skin:    General: Skin is warm and dry.  Neurological:     Mental Status: She is alert.  Psychiatric:        Mood and Affect: Mood normal.        Behavior: Behavior normal.      UC Treatments / Results  Labs (all labs ordered are listed, but only abnormal results are displayed) Labs Reviewed  POCT URINALYSIS DIP (DEVICE) - Abnormal; Notable for the following components:      Result Value   Leukocytes,Ua SMALL (*)    All other components within normal limits  POCT PREGNANCY, URINE  POC URINE PREG, ED  Pregnancy test negative  EKG   Radiology No results found.  Procedures Procedures (including critical care time)  Medications Ordered in UC Medications - No data to display  Initial Impression / Assessment and Plan / UC Course  I have reviewed the triage vital signs  and the nursing notes.  Pertinent labs & imaging results that were available during my  care of the patient were reviewed by me and considered in my medical decision making (see chart for details).     Reviewed irritable bowel.  Constipation.  Tonsil stones. Final Clinical Impressions(s) / UC Diagnoses   Final diagnoses:  Other constipation  Tonsil stone     Discharge Instructions     Tonsil stones are harmless and will likely recur  Take miralax 2 x a day until you have a BM then once a day until regularity is established Avoid laxatives Drink plenty of water   ED Prescriptions    None     PDMP not reviewed this encounter.   Raylene Everts, MD 06/22/19 2051

## 2019-09-10 HISTORY — PX: UPPER GI ENDOSCOPY: SHX6162

## 2019-10-06 ENCOUNTER — Ambulatory Visit (HOSPITAL_COMMUNITY)
Admission: EM | Admit: 2019-10-06 | Discharge: 2019-10-06 | Disposition: A | Discharge status: Home / Self Care | Payer: 59 | Attending: Family Medicine | Admitting: Family Medicine

## 2019-10-06 ENCOUNTER — Encounter (HOSPITAL_COMMUNITY): Payer: Self-pay

## 2019-10-06 ENCOUNTER — Other Ambulatory Visit: Payer: Self-pay

## 2019-10-06 DIAGNOSIS — N12 Tubulo-interstitial nephritis, not specified as acute or chronic: Secondary | ICD-10-CM | POA: Diagnosis not present

## 2019-10-06 LAB — POCT URINALYSIS DIP (DEVICE)
Bilirubin Urine: NEGATIVE
Glucose, UA: NEGATIVE mg/dL
Hgb urine dipstick: NEGATIVE
Ketones, ur: NEGATIVE mg/dL
Nitrite: NEGATIVE
Protein, ur: NEGATIVE mg/dL
Specific Gravity, Urine: 1.025 (ref 1.005–1.030)
Urobilinogen, UA: 0.2 mg/dL (ref 0.0–1.0)
pH: 7 (ref 5.0–8.0)

## 2019-10-06 LAB — POC URINE PREG, ED: Preg Test, Ur: NEGATIVE

## 2019-10-06 MED ORDER — CIPROFLOXACIN HCL 500 MG PO TABS: 500.0000 mg | ORAL_TABLET | Freq: Two times a day (BID) | ORAL | 0 refills | Status: DC

## 2019-10-06 NOTE — ED Triage Notes (Signed)
C/o left flank pain and lower abdominal pain.

## 2019-10-06 NOTE — Discharge Instructions (Addendum)
Increase fluids Take the antibiotic 2 x a day Take 2 doses today Take acetaminophen ( tylenol) for pain May take 1000 mg up to 3 times a day Return as needed

## 2019-10-06 NOTE — ED Provider Notes (Signed)
[facility]    CSN: [id number] Arrival date & time: 10/06/19  0813      History   Chief Complaint Chief Complaint  Patient presents with  . Flank Pain  . Abdominal Pain    HPI Vanessa Nelson is a 24 y.o. female.   HPI  Vanessa Nelson has a history of pyelonephritis.  She believes she is coming down with pyelonephritis again.  She is having pain in her left flank.  Radiates down the left lower abdomen.  She is having some urinary frequency.  No dysuria, hematuria, cloudy urine.  No fever or chills.  She did have vomiting over the weekend.  She does not know if it is from her kidney complaint or from her irritable bowel syndrome.  Her irritable bowel syndrome has been reasonably well controlled.  She is under the care of gastroenterology.  She had a recent EGD.  She was placed on pantoprazole for duodenitis. Patient has been told not to take NSAID drugs.  She takes Tylenol for pain.  Past Medical History:  Diagnosis Date  . Anxiety   . Fracture of upper end of left tibia 07/06/2014   fell while skateboarding  . Insomnia   . Mental disorder    bipolar  . Migraines     Patient Active Problem List   Diagnosis Date Noted  . Pain, female pelvic 07/01/2017  . Bipolar 1 disorder, depressed, mild (HCC) 10/04/2016    Class: Chronic  . 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 CHONDROPLASTY Left 07/09/2014   Procedure: CHONDROPLASTY;  Surgeon: Sheral Apley, MD;  Location: [facility];  Service: Orthopedics;  Laterality: Left;  . DENTAL SURGERY     for impacted teeth  . KNEE ARTHROSCOPY Left 07/09/2014   Procedure: ARTHROSCOPY KNEE;  Surgeon: Sheral Apley, MD;  Location: [facility];  Service: Orthopedics;  Laterality: Left;  . ORIF TIBIA PLATEAU Left 07/09/2014   Procedure: OPEN REDUCTION INTERNAL FIXATION (ORIF) TIBIAL PLATEAU;  Surgeon: Sheral Apley, MD;  Location: [facility];  Service: Orthopedics;  Laterality: Left;  . UPPER GI ENDOSCOPY  09/10/2019    OB History    Gravida  0   Para  0   Term  0   Preterm  0   AB  0   Living  0     SAB  0   TAB  0   Ectopic  0   Multiple  0   Live Births  0            Home Medications    Prior to Admission medications   Medication Sig Start Date End Date Taking? Authorizing Provider  ciprofloxacin (CIPRO) 500 MG tablet Take 1 tablet (500 mg total) by mouth 2 (two) times daily. 10/06/19   Eustace Moore, MD  dicyclomine (BENTYL) 20 MG tablet Take 20 mg by mouth 3 (three) times daily as needed for spasms.  07/30/17 07/30/18  [provider]  norgestimate-ethinyl estradiol (ORTHO-CYCLEN,SPRINTEC,PREVIFEM) 0.25-35 MG-MCG tablet Take 1 tablet by mouth daily. 07/01/17 06/22/19  Willodean Rosenthal, MD  omeprazole (PRILOSEC) 20 MG capsule Take 1 capsule (20 mg total) by mouth daily. 07/25/18 06/22/19  Azalia Bilis, MD  promethazine (PHENERGAN) 12.5 MG tablet Take 12.5 mg by mouth every 6 (six) hours as needed for nausea or vomiting.  02/04/18 06/22/19  [provider]  verapamil Areatha Keas  PM) 120 MG 24 hr capsule Take 120 mg by mouth at bedtime. 04/11/18 06/22/19  [provider]    Family History No family history on file.  Social History Social History   Tobacco Use  . Smoking status: Never Smoker  . Smokeless tobacco: Never Used  Substance Use Topics  . Alcohol use: No  . Drug use: No     Allergies   Tamiflu [oseltamivir phosphate], Flagyl [metronidazole], Latex, Nickel, Oxycodone, and Wellbutrin [bupropion]   Review of Systems Review of Systems  Constitutional: Negative for chills and fever.  Gastrointestinal: Positive for nausea and vomiting. Negative for abdominal pain.  Genitourinary: Positive for flank pain and frequency.     Physical Exam Triage Vital Signs ED Triage Vitals  Enc Vitals Group     BP  10/06/19 0847 123/81     Pulse Rate 10/06/19 0847 74     Resp 10/06/19 0847 16     Temp 10/06/19 0847 97.8 F (36.6 C)     Temp Source 10/06/19 0847 Oral     SpO2 10/06/19 0847 97 %     Weight --      Height --      Head Circumference --      Peak Flow --      Pain Score 10/06/19 0846 6     Pain Loc --      Pain Edu? --      Excl. in GC? --    No data found.  Updated Vital Signs BP 123/81 (BP Location: Left Arm)   Pulse 74   Temp 97.8 F (36.6 C) (Oral)   Resp 16   LMP 09/19/2019 (Exact Date)   SpO2 97%      Physical Exam Constitutional:      General: She is not in acute distress.    Appearance: She is well-developed and normal weight.  HENT:     Head: Normocephalic and atraumatic.  Eyes:     Conjunctiva/sclera: Conjunctivae normal.     Pupils: Pupils are equal, round, and reactive to light.  Cardiovascular:     Rate and Rhythm: Normal rate.     Heart sounds: Normal heart sounds.  Pulmonary:     Effort: Pulmonary effort is normal. No respiratory distress.     Breath sounds: Normal breath sounds.  Abdominal:     General: There is no distension.     Palpations: Abdomen is soft.     Tenderness: There is no abdominal tenderness. There is left CVA tenderness.  Musculoskeletal:        General: Normal range of motion.     Cervical back: Normal range of motion.  Skin:    General: Skin is warm and dry.  Neurological:     Mental Status: She is alert.  Psychiatric:        Mood and Affect: Mood normal.        Behavior: Behavior normal.      UC Treatments / Results  Labs (all labs ordered are listed, but only abnormal results are displayed) Labs Reviewed  POCT URINALYSIS DIP (DEVICE) - Abnormal; Notable for the following components:      Result Value   Leukocytes,Ua SMALL (*)    All other components within normal limits  URINE CULTURE  POC URINE PREG, ED    EKG   Radiology No results found.  Procedures Procedures (including critical care  time)  Medications Ordered in UC Medications - No data to display  Initial Impression / Assessment  and Plan / UC Course  I have reviewed the triage vital signs and the nursing notes.  Pertinent labs & imaging results that were available during my care of the patient were reviewed by me and considered in my medical decision making (see chart for details).     Urinalysis is pretty unremarkable for pyelonephritis however her symptoms are consistent with her prior episodes.  She does have significant CVA tenderness.  With no hematuria I doubt nephrolithiasis pyelonephritis is a possibility.  Will cover with antibiotics for culturing the urine.  She is advised to check for her urine culture in 2 days on MyChart Final Clinical Impressions(s) / UC Diagnoses   Final diagnoses:  Pyelonephritis     Discharge Instructions     Increase fluids Take the antibiotic 2 x a day Take 2 doses today Take acetaminophen ( tylenol) for pain May take 1000 mg up to 3 times a day Return as needed   ED Prescriptions    Medication Sig Dispense Auth. Provider   ciprofloxacin (CIPRO) 500 MG tablet Take 1 tablet (500 mg total) by mouth 2 (two) times daily. 14 tablet Raylene Everts, MD     PDMP not reviewed this encounter.   Raylene Everts, MD 10/06/19 [phone]

## 2019-10-07 LAB — URINE CULTURE: Special Requests: NORMAL

## 2020-05-30 ENCOUNTER — Other Ambulatory Visit: Payer: Self-pay

## 2020-05-30 ENCOUNTER — Ambulatory Visit (HOSPITAL_COMMUNITY): Admission: EM | Admit: 2020-05-30 | Discharge: 2020-05-30 | Discharge status: Against Medical Advice | Payer: 59

## 2020-11-30 ENCOUNTER — Emergency Department: Admission: EM | Admit: 2020-11-30 | Discharge: 2020-11-30 | Discharge status: Against Medical Advice | Payer: Self-pay

## 2021-01-04 ENCOUNTER — Encounter (HOSPITAL_COMMUNITY): Payer: Self-pay | Admitting: *Deleted

## 2021-01-04 ENCOUNTER — Other Ambulatory Visit: Payer: Self-pay

## 2021-01-04 ENCOUNTER — Emergency Department (HOSPITAL_COMMUNITY)
Admission: EM | Admit: 2021-01-04 | Discharge: 2021-01-04 | Disposition: A | Discharge status: Home / Self Care | Payer: 59 | Attending: Emergency Medicine | Admitting: Emergency Medicine

## 2021-01-04 DIAGNOSIS — Z5321 Procedure and treatment not carried out due to patient leaving prior to being seen by health care provider: Secondary | ICD-10-CM | POA: Diagnosis not present

## 2021-01-04 DIAGNOSIS — T7840XA Allergy, unspecified, initial encounter: Secondary | ICD-10-CM | POA: Insufficient documentation

## 2021-01-04 NOTE — ED Triage Notes (Signed)
Rash over he she has been to urgent care x 2 and was sent herfeody for 2 months  lmp July 15th

## 2021-01-04 NOTE — ED Provider Notes (Signed)
Emergency Medicine Provider Triage Evaluation Note  Vanessa Nelson , a 25 y.o. female  was evaluated in triage.  Pt complains of rash x2 months. In the process of trying to f/u with dermatology.  Review of Systems  Positive: rash Negative: fever  Physical Exam  Ht 5\' 2"  (1.575 m)   Wt 68 kg   LMP 12/09/2020   BMI 27.44 kg/m  Gen:   Awake, no distress   Resp:  Normal effort  MSK:   Moves extremities without difficulty  Other:  Papular erythematous rash to the trunk and arms  Medical Decision Making  Medically screening exam initiated at 6:12 PM.  Appropriate orders placed.  Vanessa Nelson was informed that the remainder of the evaluation will be completed by another provider, this initial triage assessment does not replace that evaluation, and the importance of remaining in the ED until their evaluation is complete.     [name], PA-C 01/04/21 1813    03/06/21, DO 01/05/21 0019

## 2021-01-04 NOTE — ED Notes (Signed)
The patient LWBS

## 2021-03-09 ENCOUNTER — Encounter (HOSPITAL_COMMUNITY): Payer: Self-pay | Admitting: Pharmacy Technician

## 2021-03-09 ENCOUNTER — Emergency Department (HOSPITAL_COMMUNITY): Payer: No Typology Code available for payment source

## 2021-03-09 ENCOUNTER — Other Ambulatory Visit: Payer: Self-pay

## 2021-03-09 ENCOUNTER — Emergency Department (HOSPITAL_COMMUNITY)
Admission: EM | Admit: 2021-03-09 | Discharge: 2021-03-09 | Disposition: A | Discharge status: Home / Self Care | Payer: No Typology Code available for payment source | Attending: Emergency Medicine | Admitting: Emergency Medicine

## 2021-03-09 DIAGNOSIS — Z8616 Personal history of COVID-19: Secondary | ICD-10-CM | POA: Insufficient documentation

## 2021-03-09 DIAGNOSIS — H539 Unspecified visual disturbance: Secondary | ICD-10-CM

## 2021-03-09 DIAGNOSIS — R1032 Left lower quadrant pain: Secondary | ICD-10-CM | POA: Insufficient documentation

## 2021-03-09 DIAGNOSIS — R112 Nausea with vomiting, unspecified: Secondary | ICD-10-CM | POA: Insufficient documentation

## 2021-03-09 DIAGNOSIS — Z91040 Latex allergy status: Secondary | ICD-10-CM | POA: Insufficient documentation

## 2021-03-09 DIAGNOSIS — H538 Other visual disturbances: Secondary | ICD-10-CM | POA: Insufficient documentation

## 2021-03-09 LAB — CBC WITH DIFFERENTIAL/PLATELET
Abs Immature Granulocytes: 0.03 10*3/uL (ref 0.00–0.07)
Basophils Absolute: 0 10*3/uL (ref 0.0–0.1)
Basophils Relative: 1 %
Eosinophils Absolute: 0.1 10*3/uL (ref 0.0–0.5)
Eosinophils Relative: 1 %
HCT: 45.6 % (ref 36.0–46.0)
Hemoglobin: 15.5 g/dL — ABNORMAL HIGH (ref 12.0–15.0)
Immature Granulocytes: 0 %
Lymphocytes Relative: 25 %
Lymphs Abs: 2 10*3/uL (ref 0.7–4.0)
MCH: 30.9 pg (ref 26.0–34.0)
MCHC: 34 g/dL (ref 30.0–36.0)
MCV: 91 fL (ref 80.0–100.0)
Monocytes Absolute: 0.5 10*3/uL (ref 0.1–1.0)
Monocytes Relative: 7 %
Neutro Abs: 5.4 10*3/uL (ref 1.7–7.7)
Neutrophils Relative %: 66 %
Platelets: 333 10*3/uL (ref 150–400)
RBC: 5.01 MIL/uL (ref 3.87–5.11)
RDW: 12.6 % (ref 11.5–15.5)
WBC: 8.1 10*3/uL (ref 4.0–10.5)
nRBC: 0 % (ref 0.0–0.2)

## 2021-03-09 LAB — COMPREHENSIVE METABOLIC PANEL
ALT: 31 U/L (ref 0–44)
AST: 27 U/L (ref 15–41)
Albumin: 4.1 g/dL (ref 3.5–5.0)
Alkaline Phosphatase: 92 U/L (ref 38–126)
Anion gap: 10 (ref 5–15)
BUN: 8 mg/dL (ref 6–20)
CO2: 26 mmol/L (ref 22–32)
Calcium: 10.2 mg/dL (ref 8.9–10.3)
Chloride: 100 mmol/L (ref 98–111)
Creatinine, Ser: 0.76 mg/dL (ref 0.44–1.00)
GFR, Estimated: >60 mL/min (ref >60)
Glucose, Bld: 94 mg/dL (ref 70–99)
Potassium: 3.7 mmol/L (ref 3.5–5.1)
Sodium: 136 mmol/L (ref 135–145)
Total Bilirubin: 0.5 mg/dL (ref 0.3–1.2)
Total Protein: 8.1 g/dL (ref 6.5–8.1)

## 2021-03-09 IMAGING — CT CT HEAD W/O CM
3 of 4 series · 15 of 47 positions shown, 18 images · non-contrast
Comparison: None.

CLINICAL DATA: Diplopia.

EXAM:
CT HEAD WITHOUT CONTRAST
TECHNIQUE: Contiguous axial images were obtained from the base of the skull
through the vertex without intravenous contrast.

[Series 4: head 2.0 h70h · axial · 0.41mm/px · z∈[-137,-19]mm · 9 of 75 slices shown, 12 images]
[im 8/75  brain]
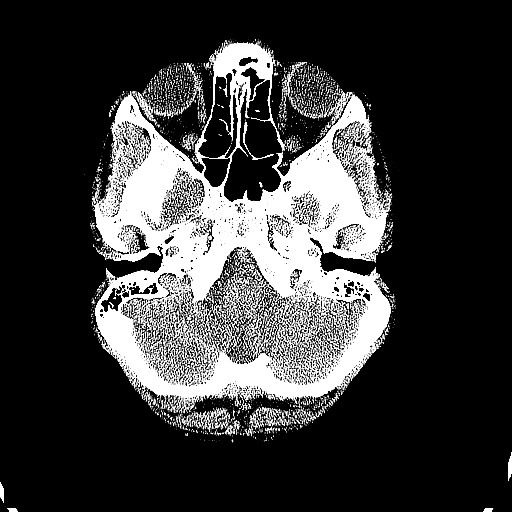
[im 8/75  bone]
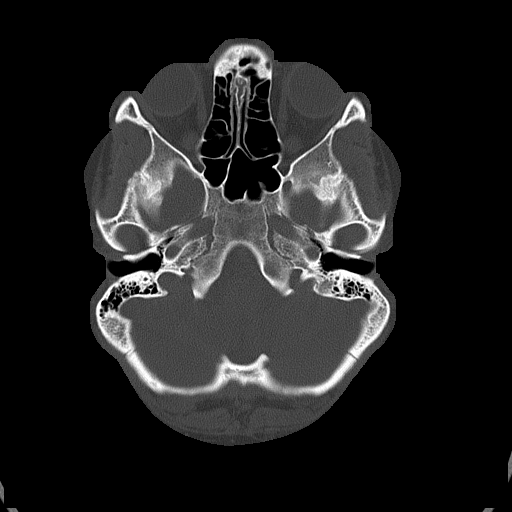
[im 15/75  brain]
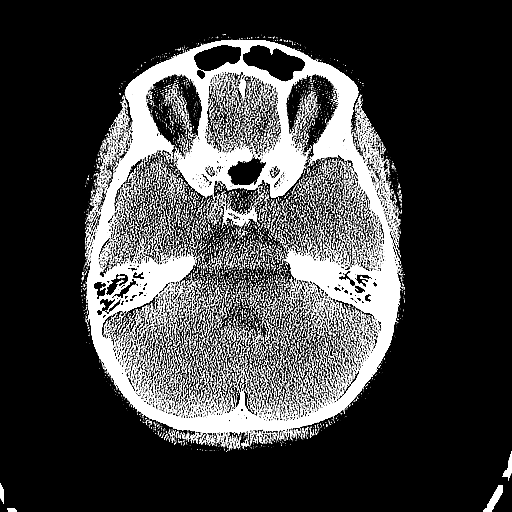
[im 23/75  brain]
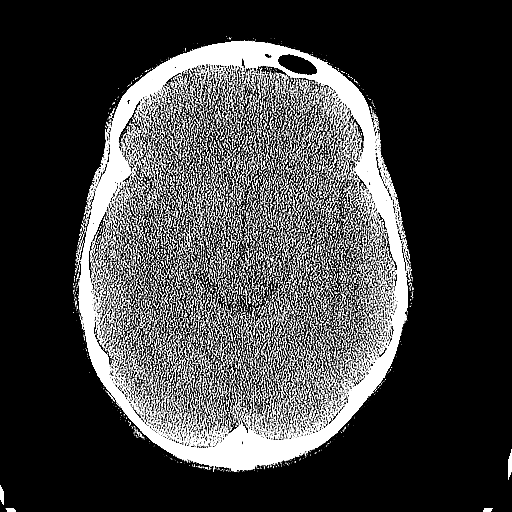
[im 30/75  brain]
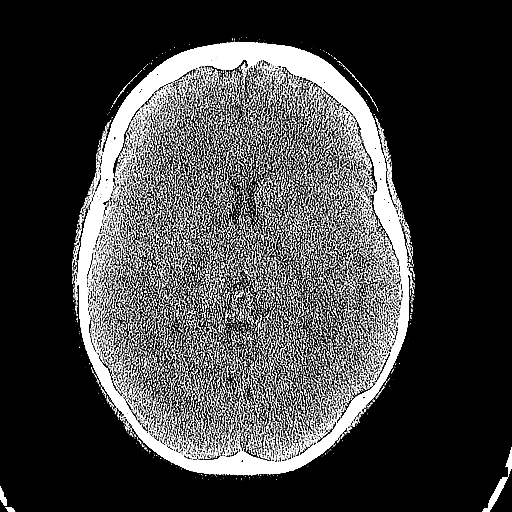
[im 38/75  brain]
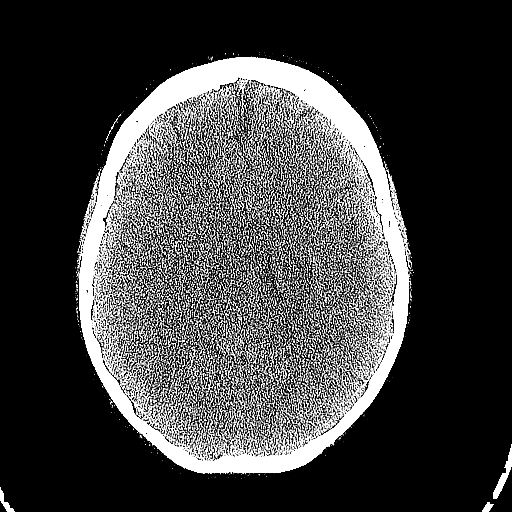
[im 38/75  bone]
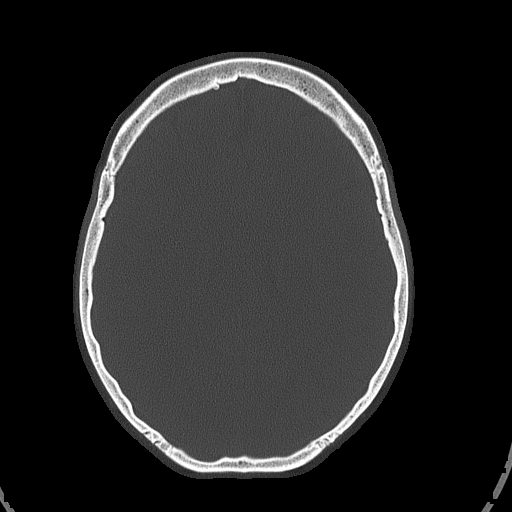
[im 45/75  brain]
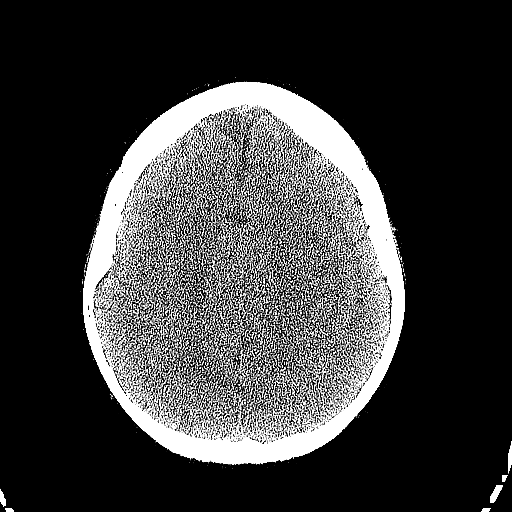
[im 52/75  brain]
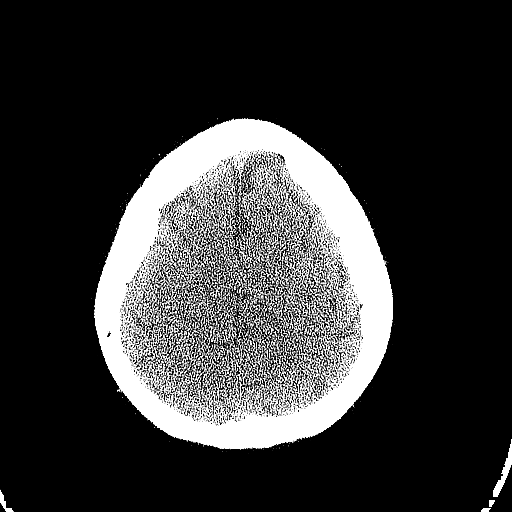
[im 60/75  brain]
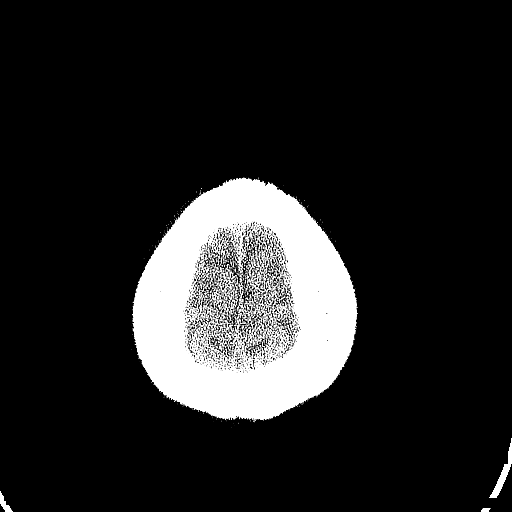
[im 67/75  brain]
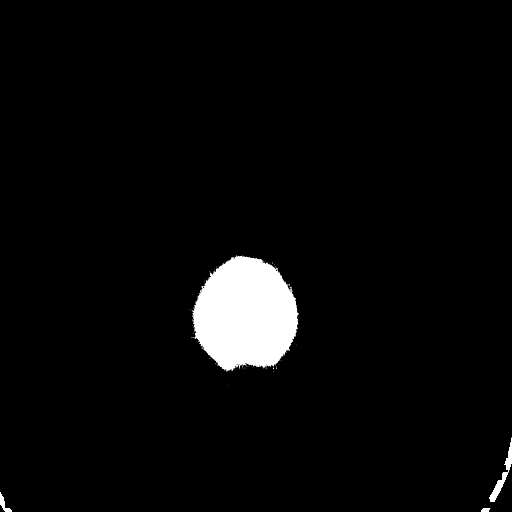
[im 67/75  bone]
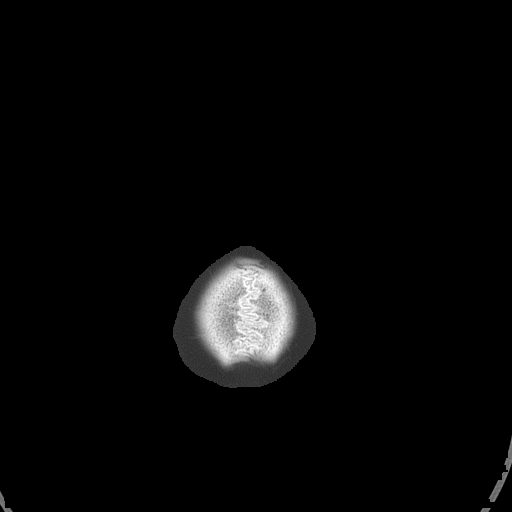

[Series 5: head 3.0 mpr cor · coronal · 0.29mm/px · 3 of 63 slices shown]
[im 21/63  brain]
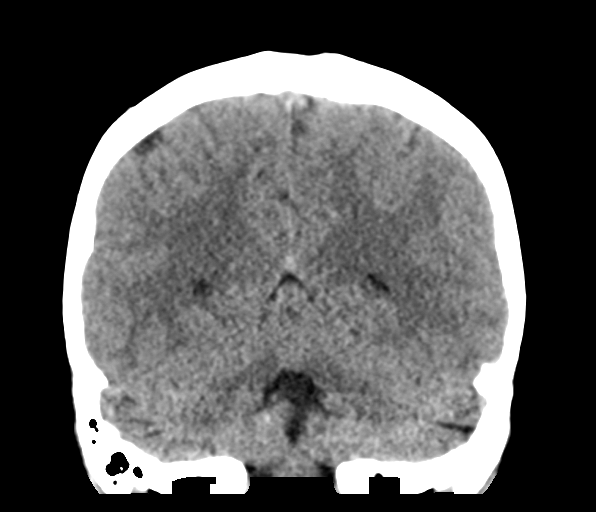
[im 28/63  brain]
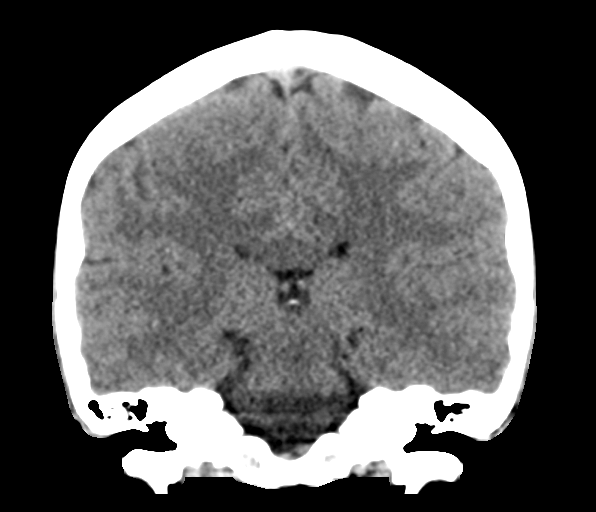
[im 35/63  brain]
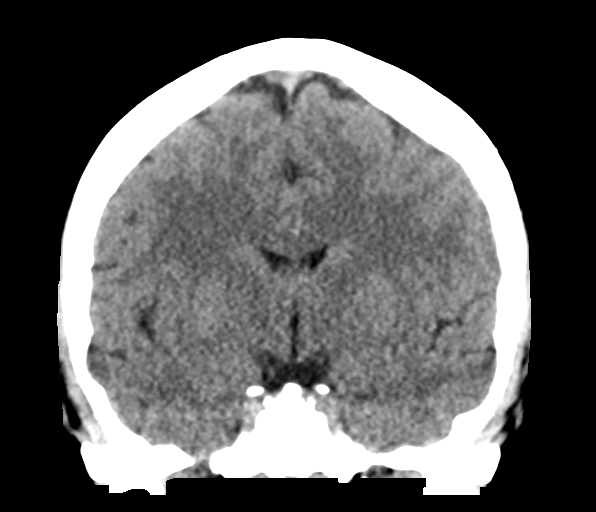

[Series 6: head 3.0 mpr sag · sagittal · 0.28mm/px · 3 of 60 slices shown]
[im 20/60  brain]
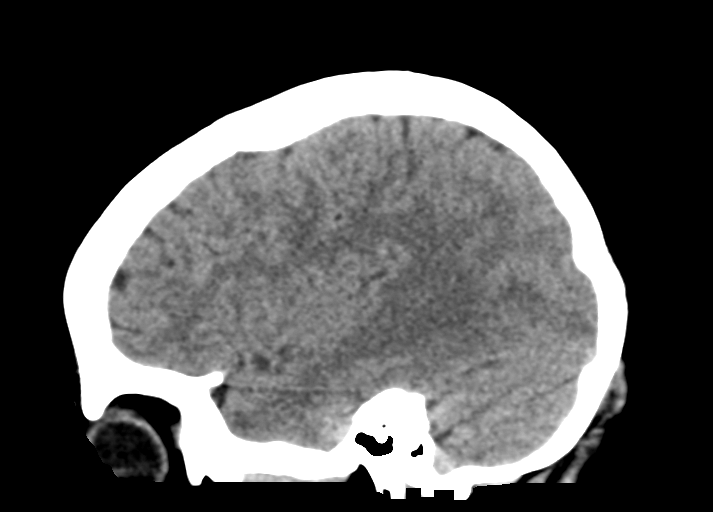
[im 30/60  brain]
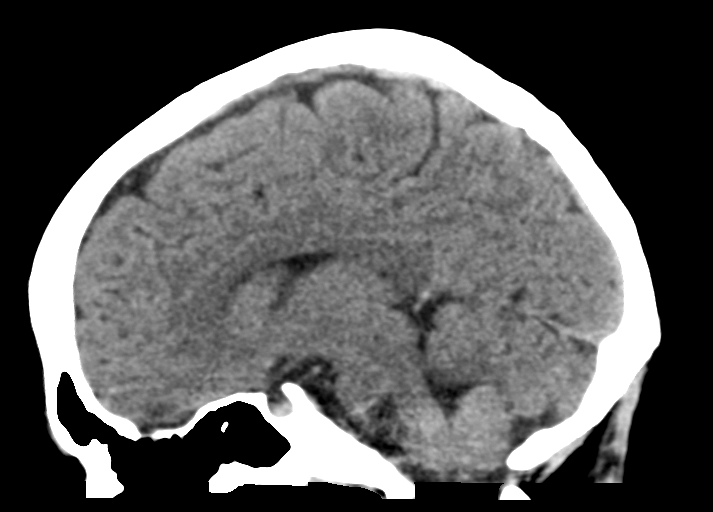
[im 40/60  brain]
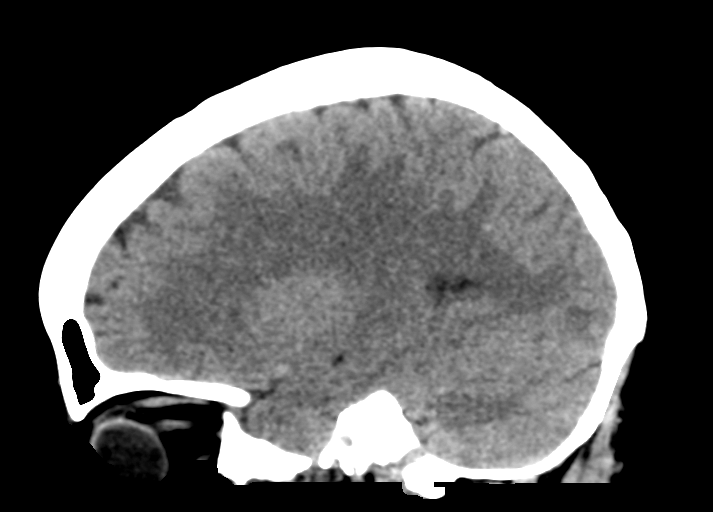

[15 of 47 positions shown; findings below may reference images not displayed]

FINDINGS: Brain: No evidence of acute infarction, hemorrhage, hydrocephalus,
extra-axial collection or mass lesion/mass effect.

Vascular: No hyperdense vessel or unexpected calcification.

Skull: Normal. Negative for fracture or focal lesion.

Sinuses/Orbits: Paranasal sinuses and mastoid air cells are clear.

Other: None
IMPRESSION: No acute intracranial abnormalities. Normal brain.

## 2021-03-09 IMAGING — MR MR THORACIC SPINE WO/W CM
5 of 9 series · 19 of 48 positions shown · IV contrast (gadavist)
Comparison: None.

CLINICAL DATA: Multiple sclerosis, new event Assessing for MS with
concern for optic neuritis. Exam was requested by neurology.

EXAM:
MRI THORACIC WITHOUT AND WITH CONTRAST
TECHNIQUE: Multiplanar and multiecho pulse sequences of the thoracic spine were
obtained without and with intravenous contrast.
CONTRAST:  6.5mL GADAVIST GADOBUTROL 1 MMOL/ML IV SOLN

[Series 14: T1 · sagittal · 3.0mm · 0.90mm/px · 1 of 12 slices shown (1 of 3)]
[im 1/12]
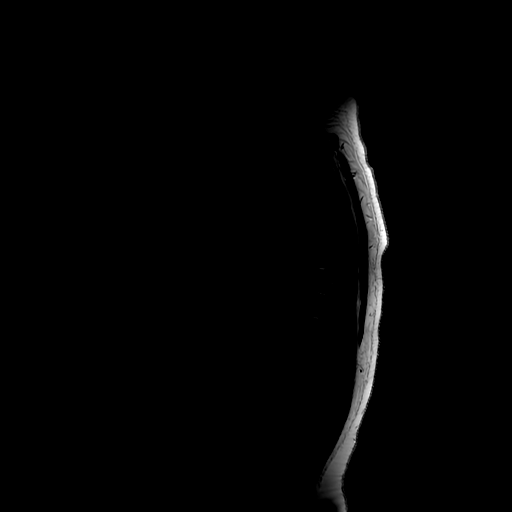

[Series 15: T2 · sagittal · 3.0mm · 0.66mm/px · 3 of 18 slices shown (1 of 2)]
[im 1/18]
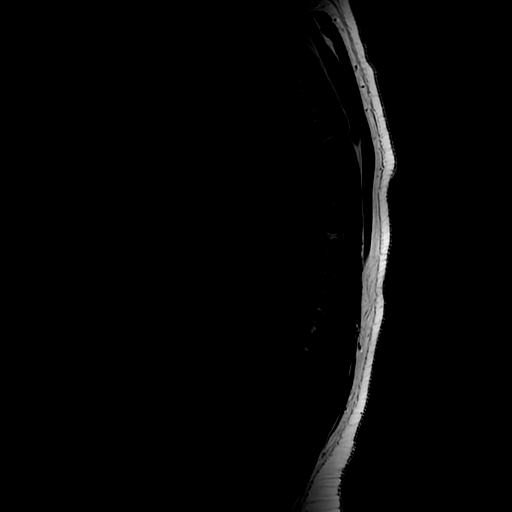
[im 9/18]
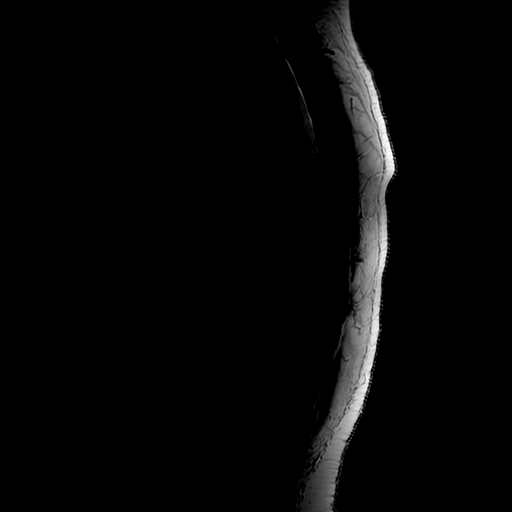
[im 18/18]
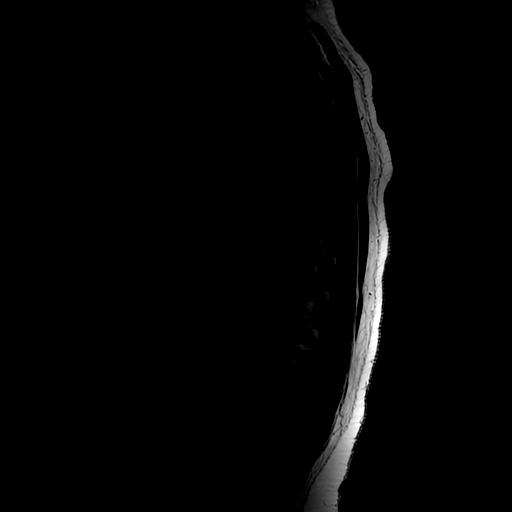

[Series 17: T1 · sagittal · 3.0mm · 0.66mm/px · 3 of 18 slices shown (2 of 3)]
[im 1/18]
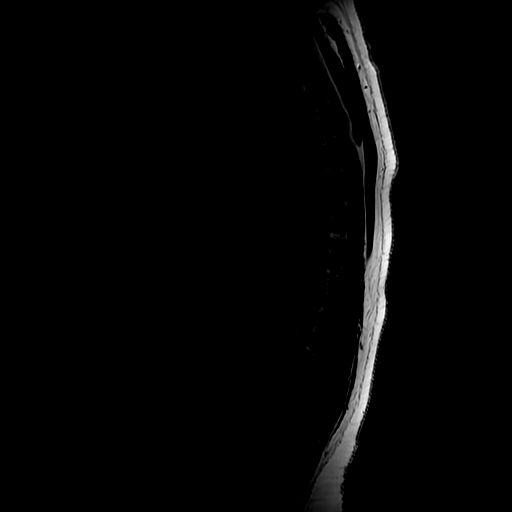
[im 9/18]
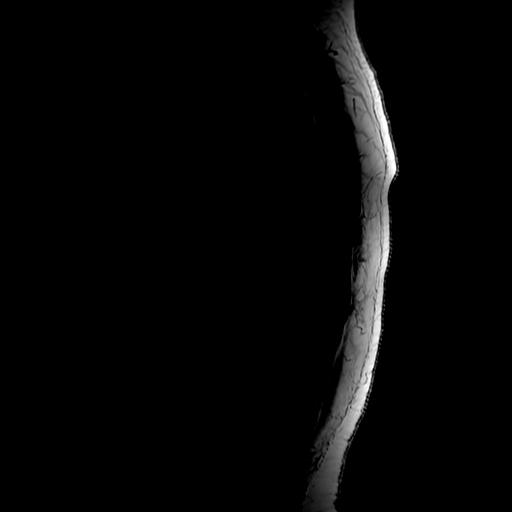
[im 18/18]
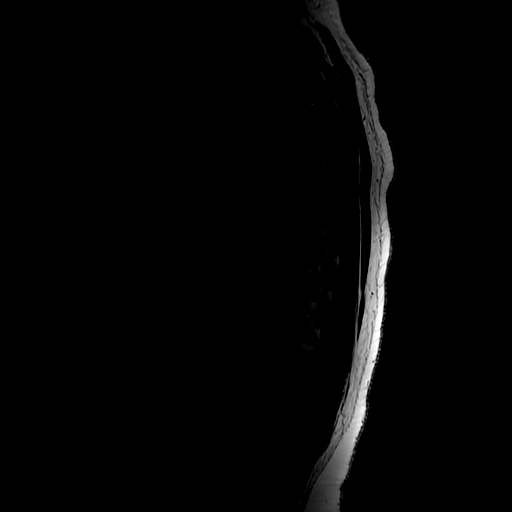

[Series 18: T2 · axial · 4.0mm · 0.39mm/px · z∈[-398,-127]mm · 9 of 51 slices shown (2 of 2)]
[im 1/51]
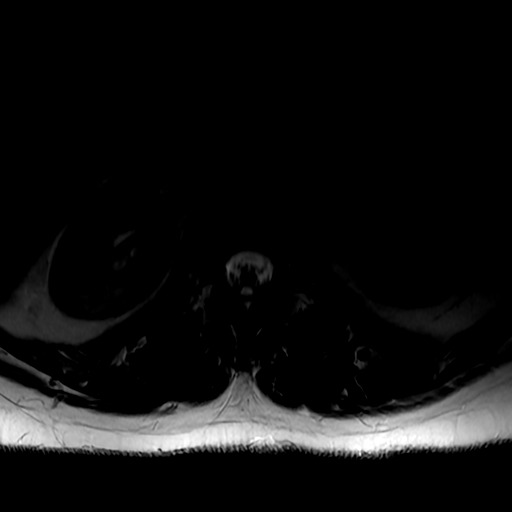
[im 7/51]
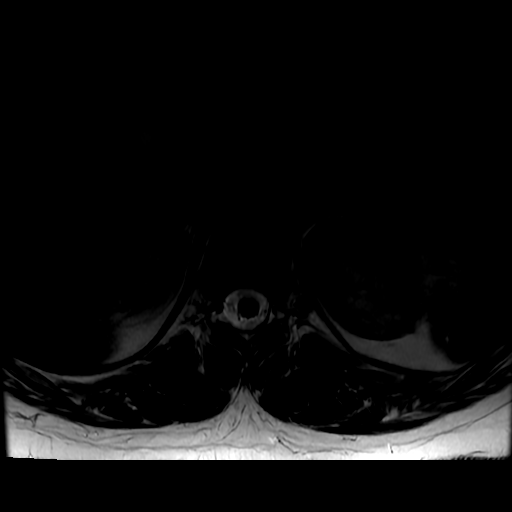
[im 13/51]
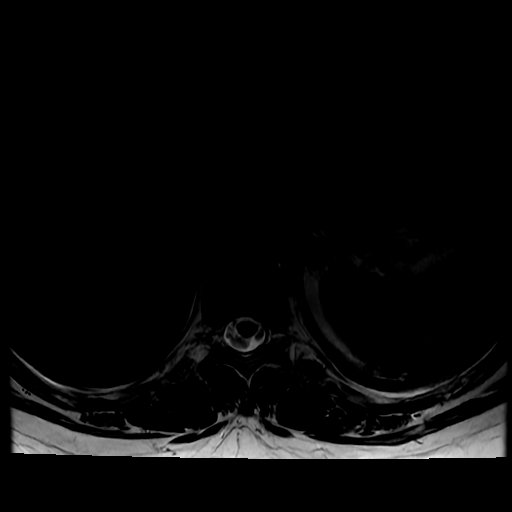
[im 19/51]
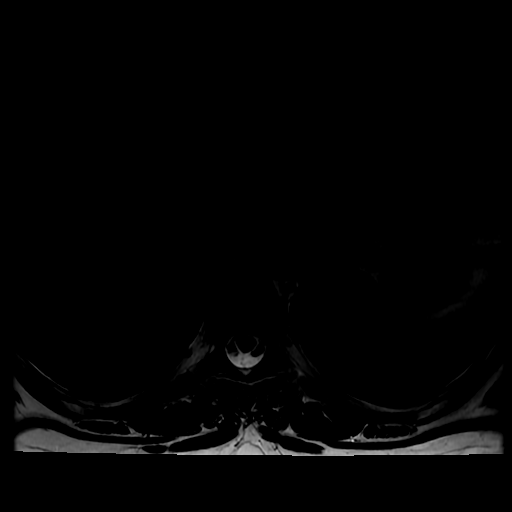
[im 26/51]
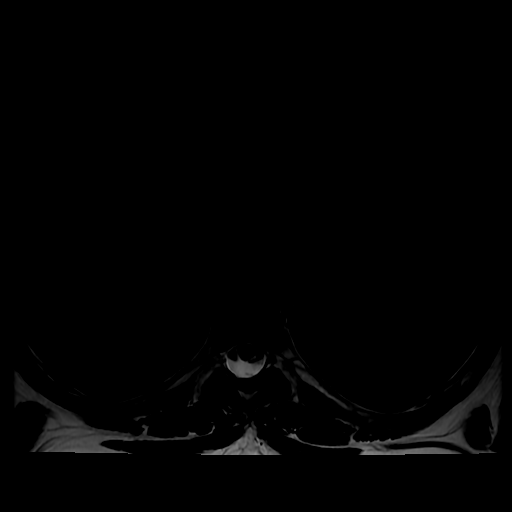
[im 32/51]
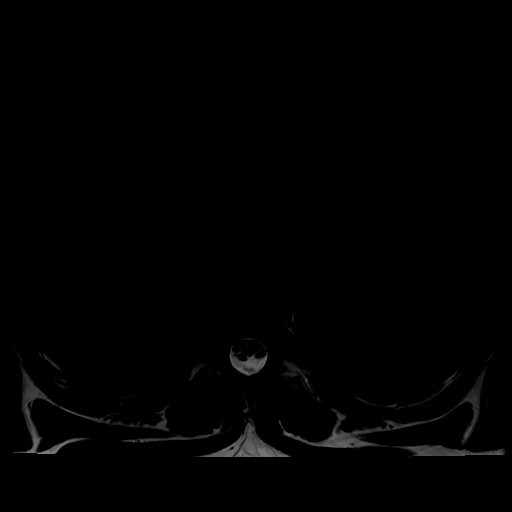
[im 38/51]
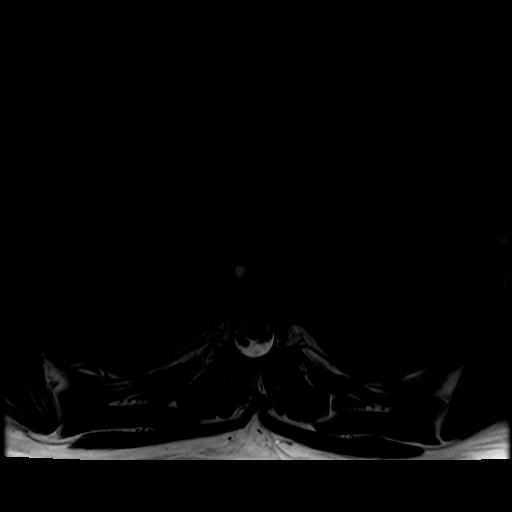
[im 44/51]
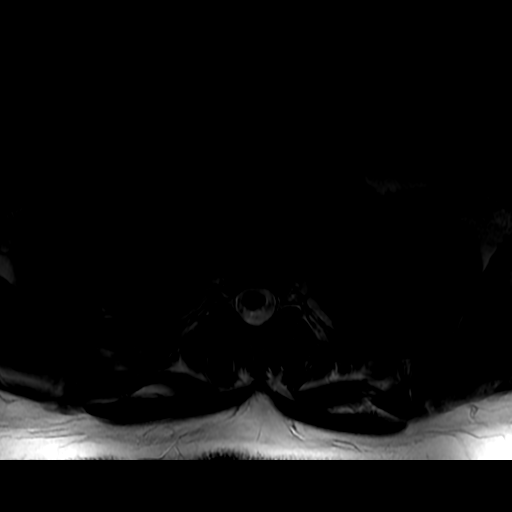
[im 51/51]
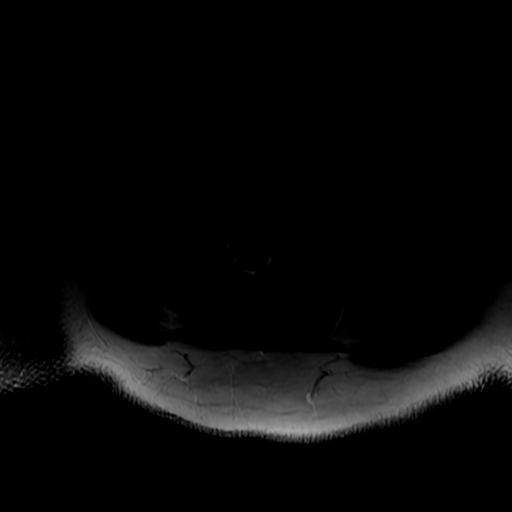

[Series 20: T1 · axial · non-contrast · 4.0mm · 0.39mm/px · z∈[-398,-333]mm · 3 of 51 slices shown (3 of 3)]
[im 1/51]
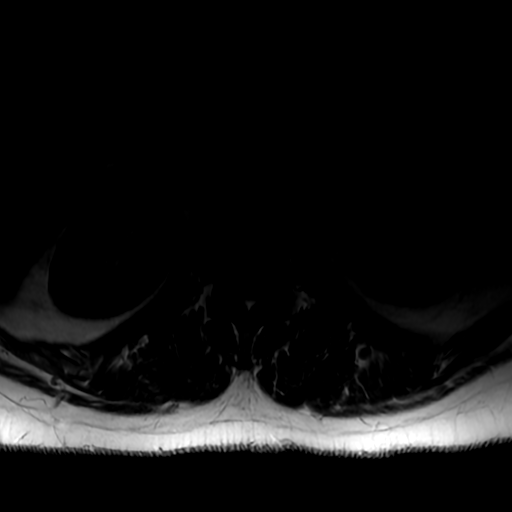
[im 7/51]
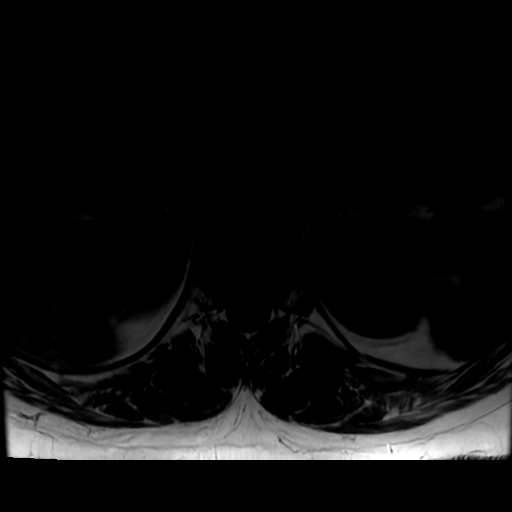
[im 13/51]
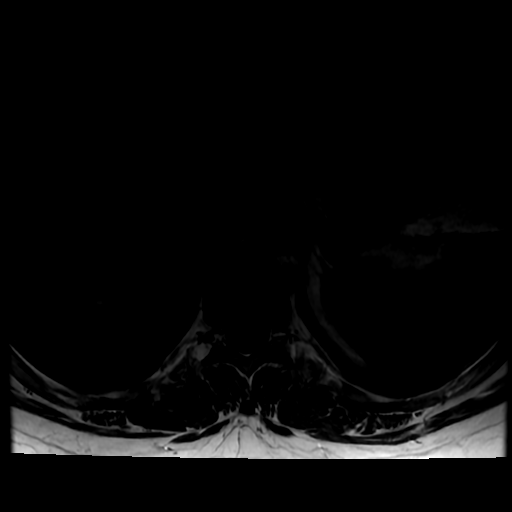

[19 of 48 positions shown; findings below may reference images not displayed]

FINDINGS: Alignment:  Normal.

Vertebrae: Vertebral body heights are maintained. No focal marrow
edema to suggest acute fracture or discitis/osteomyelitis.

Cord: Mildly motion limited evaluation without convincing cord
signal abnormality or enhancement.

Paraspinal and other soft tissues: Motion limited evaluation without
obvious acute abnormality.

Disc levels: No significant canal or foraminal stenosis. Prominent
flow voids in the dorsal canal.
IMPRESSION: 1. Mildly motion limited evaluation without convincing cord signal
abnormality or enhancement.
2. No significant stenosis.

## 2021-03-09 IMAGING — MR MR HEAD WO/W CM
7 of 13 series · 21 of 48 positions shown · IV contrast (6.5 M GAD)
Comparison: No prior MRI, correlation is made with same day CT
head.

CLINICAL DATA: Optic neuritis suspected

EXAM:
MRI HEAD AND ORBITS WITHOUT AND WITH CONTRAST
TECHNIQUE: Multiplanar, multiecho pulse sequences of the brain and surrounding
structures were obtained without and with intravenous contrast.
Multiplanar, multiecho pulse sequences of the orbits and surrounding
structures were obtained including fat saturation techniques, before
and after intravenous contrast administration.
CONTRAST:  6.5mL GADAVIST GADOBUTROL 1 MMOL/ML IV SOLN

[Series 2: DWI · axial · 3.0mm · 0.94mm/px · z∈[-25,+121]mm · 6 of 100 slices shown (1 of 2)]
[im 1/100]
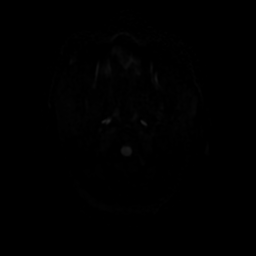
[im 20/100]
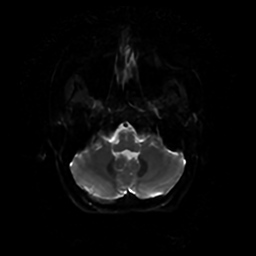
[im 40/100]
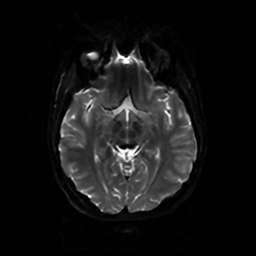
[im 60/100]
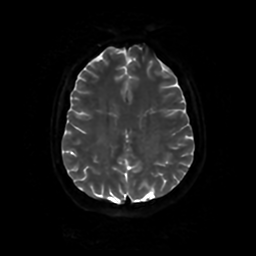
[im 80/100]
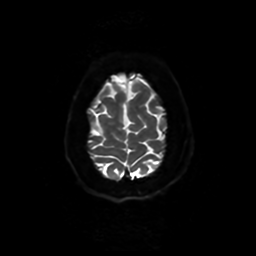
[im 100/100]
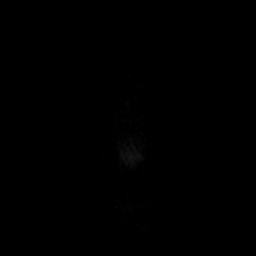

[Series 3: DWI · coronal · 4.0mm · 0.94mm/px · 5 of 74 slices shown (2 of 2)]
[im 1/74]
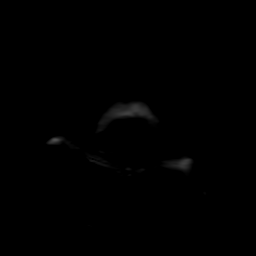
[im 19/74]
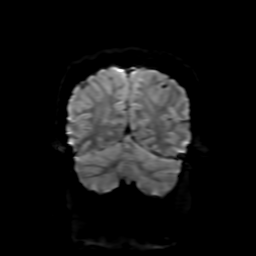
[im 37/74]
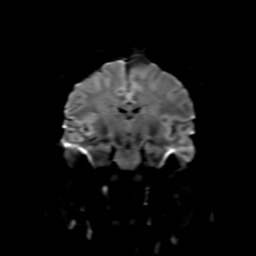
[im 55/74]
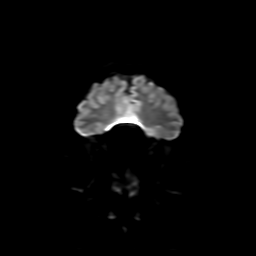
[im 74/74]
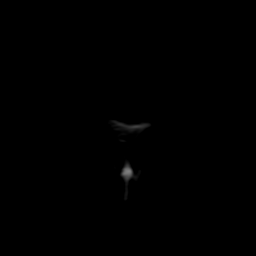

[Series 4: FLAIR · sagittal · 5.0mm · 0.23mm/px · 2 of 25 slices shown (1 of 2)]
[im 1/25]
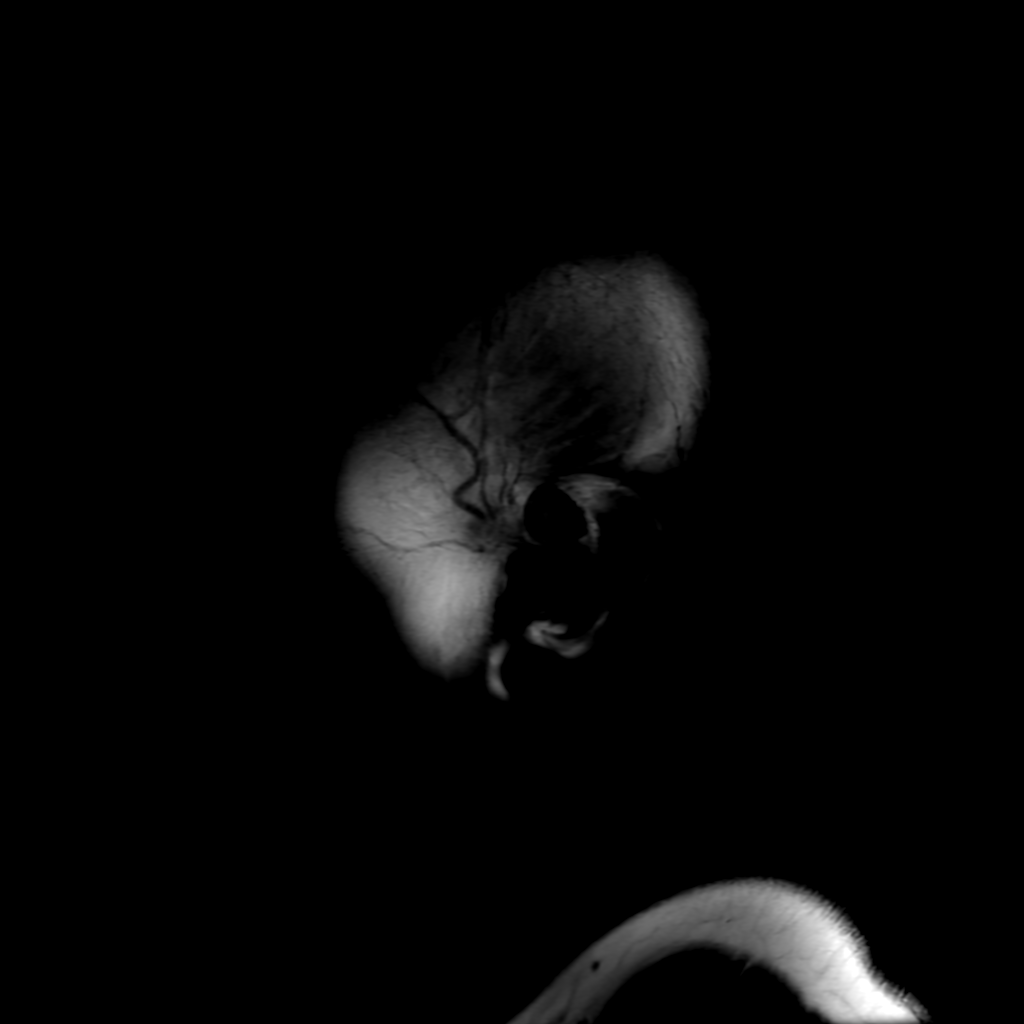
[im 25/25]
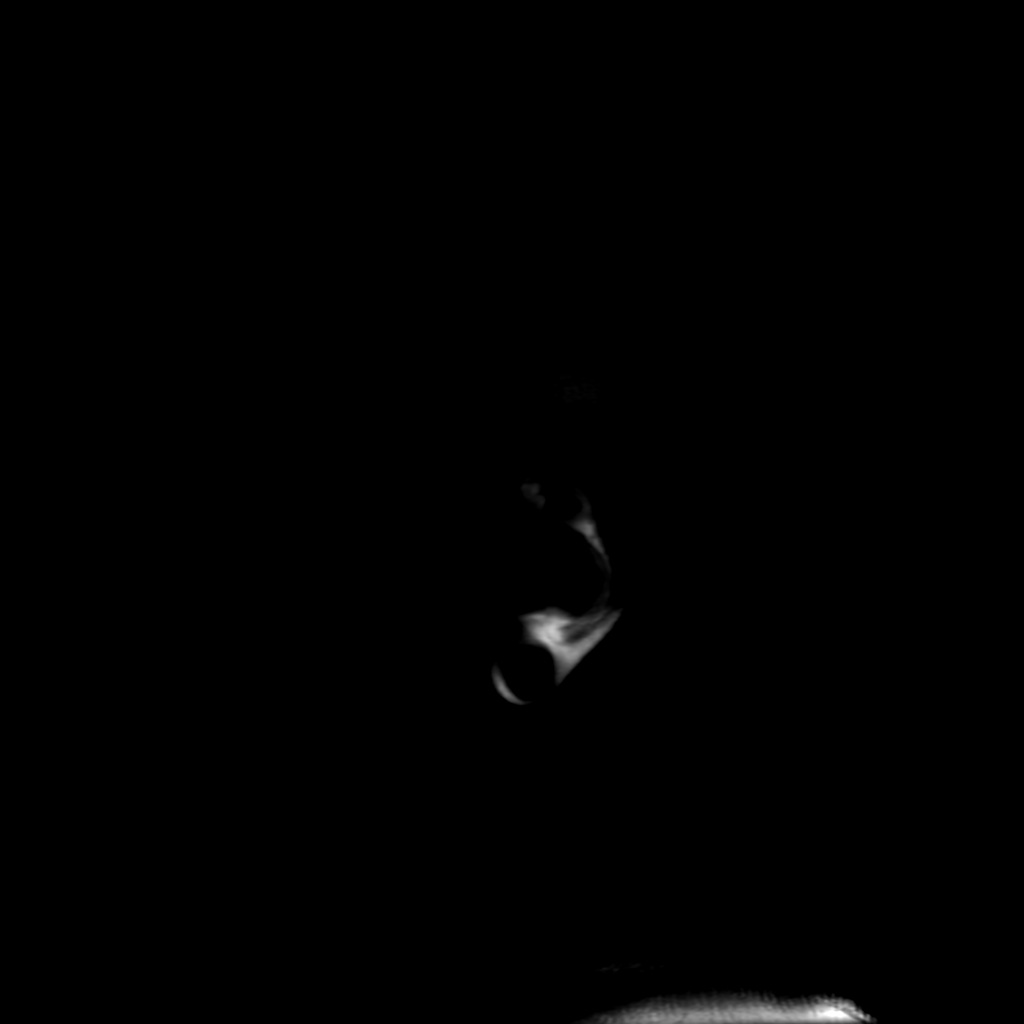

[Series 5: T2 · axial · 5.0mm · 0.23mm/px · 1 of 26 slices shown]
[im 1/26]
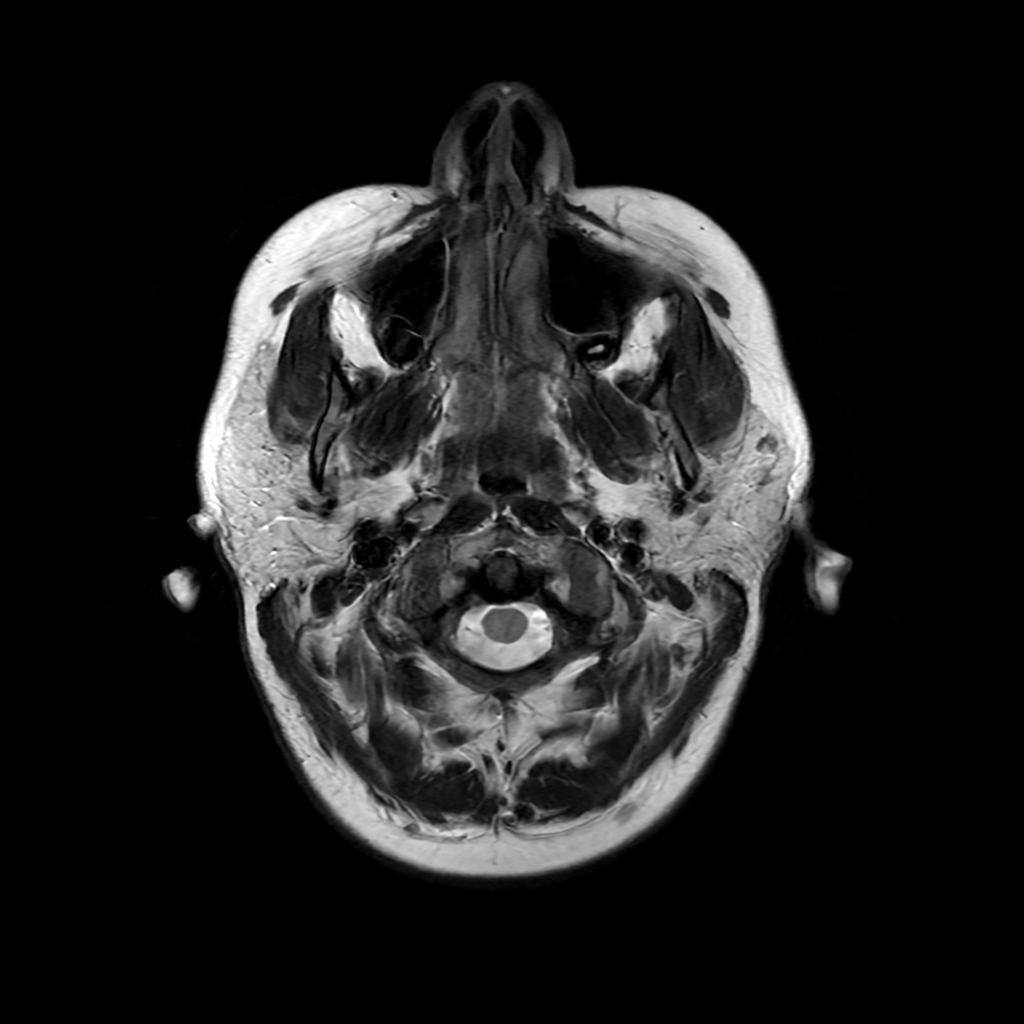

[Series 6: FLAIR · axial · 4.0mm · 0.45mm/px · z∈[-16,+132]mm · 2 of 35 slices shown (2 of 2)]
[im 1/35]
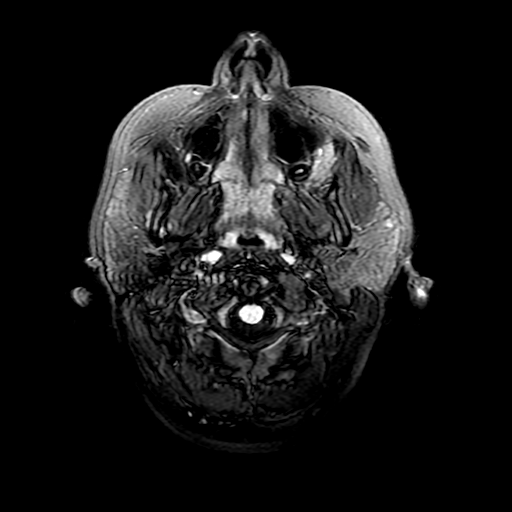
[im 35/35]
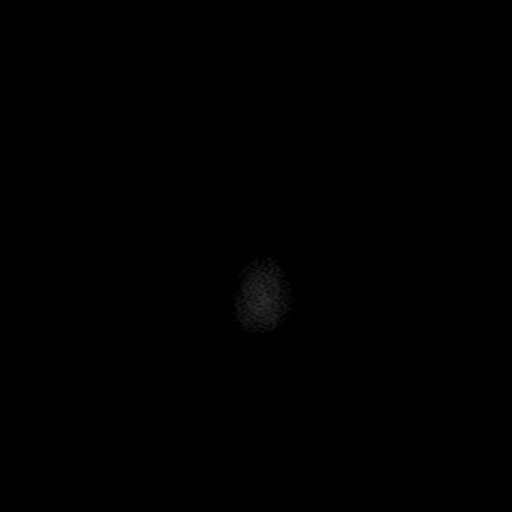

[Series 250: ADC · axial · 3.0mm · 0.94mm/px · z∈[-25,+121]mm · 3 of 50 slices shown (1 of 2)]
[im 1/50]
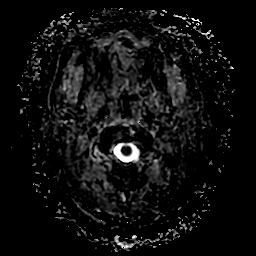
[im 25/50]
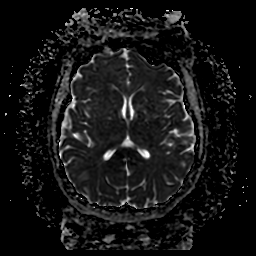
[im 50/50]
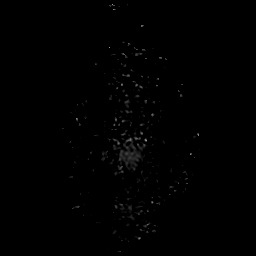

[Series 350: ADC · coronal · 4.0mm · 0.94mm/px · 2 of 37 slices shown (2 of 2)]
[im 1/37]
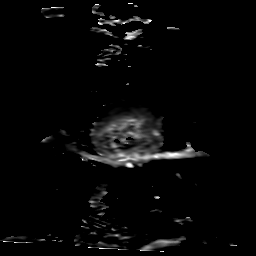
[im 37/37]
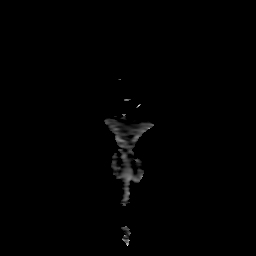

[21 of 48 positions shown; findings below may reference images not displayed]

FINDINGS: MRI HEAD FINDINGS

Brain: No acute infarction, hemorrhage, hydrocephalus, extra-axial
collection or mass lesion. No foci of increased T2 signal in the
periventricular, juxtacortical, or infratentorial white matter. No
abnormal enhancement.

Vascular: Normal flow voids.

Skull and upper cervical spine: Normal marrow signal.

Other: None.

MRI ORBITS FINDINGS

Orbits: Evaluation on postcontrast imaging of the orbits is somewhat
limited by motion artifact. Within limitation, no abnormal
enhancement within the orbits. No traumatic or inflammatory finding.
The globes, optic nerves, orbital fat, extraocular muscles, and
vascular structures are unremarkable.

Visualized sinuses: Clear.

Soft tissues: Negative.
IMPRESSION: 1. No acute intracranial process. No T2 hyperintense foci to suggest
demyelinating disease.
2. Normal MRI of the orbits.

## 2021-03-09 MED ORDER — GADOBUTROL 1 MMOL/ML IV SOLN
6.5000 mL | Freq: Once | INTRAVENOUS | Status: AC | PRN
  Administered 2021-03-09: 6.5 mL via INTRAVENOUS

## 2021-03-09 MED ORDER — TETRACAINE HCL 0.5 % OP SOLN
2.0000 [drp] | Freq: Once | OPHTHALMIC | Status: DC
  Filled 2021-03-09: qty 4

## 2021-03-09 NOTE — ED Provider Notes (Signed)
Emergency Medicine Provider Triage Evaluation Note  Vanessa Nelson , a 25 y.o. female  was evaluated in triage.  Pt complains of right eye vision changes.  States she feels like she has a cloud in her entire right eye however has right eye medial aspect vision loss.  Has had issues with her pituitary before.  Patient states her pituitary has been "swollen" and her endocrinologist was concerned about this.  Denies any eye pain, numbness or tingling, headache, neck pain.  No prior history of MS, dissection, immunocompromised state.  Called her PCP told to come to emergency department for evaluation.  Review of Systems  Positive: Vision changes Negative: Numbness, weakness  Physical Exam  There were no vitals taken for this visit. Gen:   Awake, no distress   Resp:  Normal effort  MSK:   Moves extremities without difficulty  Other:  Right eye medial aspect decreased vision  Medical Decision Making  Medically screening exam initiated at 10:50 AM.  Appropriate orders placed.  Forrest Demuro was informed that the remainder of the evaluation will be completed by another provider, this initial triage assessment does not replace that evaluation, and the importance of remaining in the ED until their evaluation is complete.  Vision changes   Linwood Dibbles, PA-C 03/09/21 1052    Tegeler, Canary Brim, MD 03/09/21 1109

## 2021-03-09 NOTE — ED Triage Notes (Signed)
Pt here with reports of loss of peripheral vision in R eye. Hx of pituitary issues. Pt with no other neuro deficits.

## 2021-03-09 NOTE — Discharge Instructions (Addendum)
Follow-up with one of the neurology groups.  Also follow-up with ophthalmology with the vision loss.  No clear cause of the vision loss was found but the MRI was reassuring

## 2021-03-09 NOTE — ED Provider Notes (Signed)
[facility] EMERGENCY DEPARTMENT Provider Note   CSN: [id number] Arrival date & time: 03/09/21  1015     History Chief Complaint  Patient presents with   Loss of Vision    Vanessa Nelson is a 25 y.o. female with a history of bipolar 1 presents with vision abnormality in her right eye that is progressively worsened since 03/06/21.  She states that on Monday she became very sick with nausea and vomiting and began losing her peripheral vision in her right eye.  It is then progressed to a cloudiness over her entire right eye with some peripheral floaters today.  She feels that she has had a little bit of weakness in the bottom of her right eye, dizziness that is intermittent for 5 to 10 minutes since Monday, abdominal pain (has a history of IBS), N/B (on Monday).  Patient notes that she has been having issues with her thyroid and pituitary gland since 2019/2020.  She states that she was working with her endocrinologist and at that time she was on Lamictal and birth control.  She states they stopped her medications and no significant improvement.  In August of this year, she had COVID and was given several medications including steroids for treatment and states that she has been having some issues since then.  She states that she has had head imaging before in the last 3 years.    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    Past Surgical History:  Procedure Laterality Date   CHONDROPLASTY Left 07/09/2014   Procedure: CHONDROPLASTY;  Surgeon: Sheral Apley, MD;  Location: [facility];  Service: Orthopedics;  Laterality: Left;   DENTAL SURGERY     for impacted teeth   KNEE ARTHROSCOPY Left 07/09/2014   Procedure: ARTHROSCOPY KNEE;  Surgeon: Sheral Apley, MD;  Location: [facility];  Service: Orthopedics;  Laterality: Left;   ORIF TIBIA PLATEAU Left 07/09/2014   Procedure: OPEN REDUCTION INTERNAL FIXATION (ORIF) TIBIAL PLATEAU;  Surgeon: Sheral Apley, MD;  Location: [facility];  Service: Orthopedics;  Laterality: Left;   UPPER GI ENDOSCOPY  09/10/2019     OB History     Gravida  0   Para  0   Term  0   Preterm  0   AB  0   Living  0      SAB  0   IAB  0   Ectopic  0   Multiple  0   Live Births  0           No family history on file.  Social History   Tobacco Use   Smoking status: Never   Smokeless tobacco: Never  Vaping Use   Vaping Use: Never used  Substance Use Topics   Alcohol use: No   Drug use: No    Home Medications Prior to Admission medications   Medication Sig Start Date End Date Taking? Authorizing Provider  dicyclomine (BENTYL) 20 MG tablet Take 20 mg by  mouth 3 (three) times daily as needed for spasms.  07/30/17 03/09/21 Yes [provider]  pantoprazole (PROTONIX) 40 MG tablet Take 40 mg by mouth daily.   Yes [provider]  norgestimate-ethinyl estradiol (ORTHO-CYCLEN,SPRINTEC,PREVIFEM) 0.25-35 MG-MCG tablet Take 1 tablet by mouth daily. 07/01/17 06/22/19  Willodean Rosenthal, MD  omeprazole (PRILOSEC) 20 MG capsule Take 1 capsule (20 mg total) by mouth daily. 07/25/18 06/22/19  Azalia Bilis, MD  promethazine (PHENERGAN) 12.5 MG tablet Take 12.5 mg by mouth every 6 (six) hours as needed for nausea or vomiting.  02/04/18 06/22/19  [provider]  verapamil (VERELAN PM) 120 MG 24 hr capsule Take 120 mg by mouth at bedtime. 04/11/18 06/22/19  [provider]    Allergies    Ibuprofen, Tamiflu [oseltamivir phosphate], Dexamethasone, Flagyl  [metronidazole], Latex, Nickel, Oxycodone, and Wellbutrin [bupropion]  Review of Systems   Review of Systems  Constitutional:  Positive for diaphoresis ("hot flashes"). Negative for chills and fever.  HENT:  Negative for congestion and trouble swallowing.   Eyes:  Positive for pain and visual disturbance (peripheral floaters in right eye, "cloudy" vision in right eye, decreased right peripheral vision).  Respiratory:  Negative for apnea, cough, chest tightness, shortness of breath and wheezing.   Cardiovascular:  Positive for palpitations. Negative for chest pain and leg swelling.  Gastrointestinal:  Positive for abdominal pain (in the setting of IBS), nausea (on Monday) and vomiting (on Monday). Negative for abdominal distention.  Genitourinary:  Negative for difficulty urinating and vaginal discharge.  Musculoskeletal:  Negative for arthralgias.  Neurological:  Positive for dizziness (intermittently) and weakness. Negative for numbness and headaches.   Physical Exam Updated Vital Signs BP (!) 120/93   Pulse 84   Temp 98.9 F (37.2 C) (Oral)   Resp 16   SpO2 96%   Physical Exam Constitutional:      Appearance: Normal appearance. She is normal weight.  HENT:     Head: Normocephalic and atraumatic.     Nose: Nose normal.     Mouth/Throat:     Mouth: Mucous membranes are moist.     Pharynx: Oropharynx is clear.  Eyes:     General: Lids are normal. No scleral icterus.       Right eye: No discharge.        Left eye: No discharge.     Conjunctiva/sclera: Conjunctivae normal.     Pupils: Pupils are equal, round, and reactive to light.     Comments: Pain with superior ocular movement for right eye. Decreased peripheral field of vision of the right eye. No   Cardiovascular:     Rate and Rhythm: Normal rate and regular rhythm.     Pulses: Normal pulses.     Heart sounds: Normal heart sounds.  Pulmonary:     Effort: Pulmonary effort is normal.     Breath sounds: Normal breath  sounds.  Abdominal:     General: Abdomen is flat.     Palpations: Abdomen is soft.     Tenderness: There is abdominal tenderness (mildly tender in LLQ).  Musculoskeletal:        General: Normal range of motion.     Cervical back: Normal range of motion and neck supple.  Skin:    General: Skin is warm and dry.     Capillary Refill: Capillary refill takes less than 2 seconds.  Neurological:     Mental Status: She is alert.     Motor: Weakness (4/5 strength in BLE) present.  ED Results / Procedures / Treatments   Labs (all labs ordered are listed, but only abnormal results are displayed) Labs Reviewed  CBC WITH DIFFERENTIAL/PLATELET - Abnormal; Notable for the following components:      Result Value   Hemoglobin 15.5 (*)    All other components within normal limits  COMPREHENSIVE METABOLIC PANEL    EKG None  Radiology CT HEAD WO CONTRAST ( )  Result Date: 03/09/2021 CLINICAL DATA:  Diplopia. EXAM: CT HEAD WITHOUT CONTRAST TECHNIQUE: Contiguous axial images were obtained from the base of the skull through the vertex without intravenous contrast. COMPARISON:  None. FINDINGS: Brain: No evidence of acute infarction, hemorrhage, hydrocephalus, extra-axial collection or mass lesion/mass effect. Vascular: No hyperdense vessel or unexpected calcification. Skull: Normal. Negative for fracture or focal lesion. Sinuses/Orbits: Paranasal sinuses and mastoid air cells are clear. Other: None IMPRESSION: No acute intracranial abnormalities. Normal brain. Electronically Signed   By: Signa Kell M.D.   On: 03/09/2021 13:15    Procedures Procedures   Medications Ordered in ED Medications - No data to display  ED Course  I have reviewed the triage vital signs and the nursing notes.  Pertinent labs & imaging results that were available during my care of the patient were reviewed by me and considered in my medical decision making (see chart for details).    MDM Rules/Calculators/A&P                          Ivadell Gaul is a 25 y.o. female with a history of bipolar 1 presents with vision abnormality in her right eye that is progressively worsened since 03/06/21.  Patient reports a history of swelling of her pituitary gland in the past as well as her thyroid and has previously been followed by endocrinology.  Endocrinology notes from 2018 and 2019 could be viewed but there was no notes available that discussed the pituitary gland. No available notes from prior head imaging.  Physical exam with concerns for decreased peripheral vision in the right eye as well as pain. On exam, concern for red desaturation. CT head without contrast was obtained. Results were negative.  Consulted with neurology as exam findings were concerning for optic neuritis and they requested MRI W and WO contrast of the brain/orbits/T-spine. Pending MRI results, patient will likely need admission to the hospital for further management of optic neuritis.  Discussed plan of care with Dr. Rubin Payor.  Final Clinical Impression(s) / ED Diagnoses Final diagnoses:  Vision disturbance     Evelena Leyden, DO 03/09/21 1613    Tegeler, Canary Brim, MD 03/11/21 1343

## 2021-03-09 NOTE — ED Notes (Signed)
Patient transported to MRI 

## 2021-03-17 ENCOUNTER — Encounter: Payer: Self-pay | Admitting: Neurology

## 2021-04-04 NOTE — Progress Notes (Signed)
NEUROLOGY CONSULTATION NOTE  Vanessa Nelson MRN: [id number] DOB: 08/26/1995  Referring provider: Davonna Belling, MD (ED referral) Primary care provider: Alma Friendly, MD  Reason for consult:  visual disturbance  Assessment/Plan:   Probable right optic neuritis  Check MRI of cervical spine with and without contrast.  She is scheduled for repeat MRI orbits tomorrow - will see if we can add the C-spine. Check serum ANA, sed rate, ACE, NMO antibodies Set up for lumbar puncture for CSF analysis:  cell count, protein, glucose, gram stain and culture, oligoclonal bands, IgG index, cytology As it has already been a month since onset of symptoms, high-dose steroids is unlikely to be beneficial unless MRI shows active lesions. Follow up after testing.   Subjective:  Vanessa Nelson is a 25 year old right-handed female with migraines and Bipolar disorder who presents for visual disturbance.  History supplemented by ED and ophthalmology notes.  Around 03/06/2021, she became sick with nausea and vomiting.  Afterwards, she developed blurred vision involving the nasal aspect of her right eye.  Over the next couple of days, it progressed to involve the entire right visual field as well as color desaturation.  She also reported right ocular pain and right sided headache.  She went to the ED on 03/09/2021 for further evaluation.  MRI of brain and orbits with and without contrast personally reviewed were unremarkable for evidence of MS or optic neuritis.  MRI of thoracic spine was unremarkable.  She saw ophthalmology on 03/17/2021 demonstrated OD nasal depression respecting the vertical midline on HVF but no evidence of optic nerve or disc edema.  She is on a small dose of methylprednislone for skin condition.  At this time, she continues to have vision loss in the right eye with some ocular pain with movement.  FH: migraines but not MS  PAST MEDICAL HISTORY: Past Medical History:  Diagnosis Date    Anxiety    Fracture of upper end of left tibia 07/06/2014   fell while skateboarding   Insomnia    Mental disorder    bipolar   Migraines     PAST SURGICAL HISTORY: Past Surgical History:  Procedure Laterality Date   CHONDROPLASTY Left 07/09/2014   Procedure: CHONDROPLASTY;  Surgeon: Renette Butters, MD;  Location: [place];  Service: Orthopedics;  Laterality: Left;   DENTAL SURGERY     for impacted teeth   KNEE ARTHROSCOPY Left 07/09/2014   Procedure: ARTHROSCOPY KNEE;  Surgeon: Renette Butters, MD;  Location: [place];  Service: Orthopedics;  Laterality: Left;   ORIF TIBIA PLATEAU Left 07/09/2014   Procedure: OPEN REDUCTION INTERNAL FIXATION (ORIF) TIBIAL PLATEAU;  Surgeon: Renette Butters, MD;  Location: [place];  Service: Orthopedics;  Laterality: Left;   UPPER GI ENDOSCOPY  09/10/2019    MEDICATIONS: Current Outpatient Medications on File Prior to Visit  Medication Sig Dispense Refill   dicyclomine (BENTYL) 20 MG tablet Take 20 mg by mouth 3 (three) times daily as needed for spasms.      pantoprazole (PROTONIX) 40 MG tablet Take 40 mg by mouth daily.     [DISCONTINUED] norgestimate-ethinyl estradiol (ORTHO-CYCLEN,SPRINTEC,PREVIFEM) 0.25-35 MG-MCG tablet Take 1 tablet by mouth daily. 1 Package 11   [DISCONTINUED] omeprazole (PRILOSEC) 20 MG capsule Take 1 capsule (20 mg total) by mouth daily. 30 capsule 0   [DISCONTINUED] promethazine (PHENERGAN) 12.5 MG tablet Take 12.5 mg by mouth every 6 (six) hours as needed for nausea or vomiting.      [  DISCONTINUED] verapamil (VERELAN PM) 120 MG 24 hr capsule Take 120 mg by mouth at bedtime.     No current facility-administered medications on file prior to visit.    ALLERGIES: Allergies  Allergen Reactions   Ibuprofen Nausea And Vomiting    Other reaction(s): Abdominal Pain   Tamiflu [Oseltamivir Phosphate] Nausea And Vomiting and Rash   Dexamethasone Hives   Flagyl [Metronidazole]  Rash   Latex Other (See Comments)    Makes skin raw   Nickel Rash   Oxycodone Nausea And Vomiting   Wellbutrin [Bupropion] Nausea Only and Other (See Comments)    Patient had insomnia and nausea and she could not    FAMILY HISTORY: No family history on file.  Objective:  Blood pressure 119/73, pulse 81, height 5\' 2"  (1.575 m), weight 178 lb 3.2 oz (80.8 kg), SpO2 99 %. General: No acute distress.  Patient appears well-groomed.   Head:  Normocephalic/atraumatic Eyes:  fundi examined but not visualized Neck: supple, no paraspinal tenderness, full range of motion Back: No paraspinal tenderness Heart: regular rate and rhythm Lungs: Clear to auscultation bilaterally. Vascular: No carotid bruits. Neurological Exam: Mental status: alert and oriented to person, place, and time, recent and remote memory intact, fund of knowledge intact, attention and concentration intact, speech fluent and not dysarthric, language intact. Cranial nerves: CN I: not tested CN II: pupils equal, round and reactive to light, reduced upper and lower nasal quadrants vision loss in right eye.  No APD. CN III, IV, VI:  full range of motion, no nystagmus, no ptosis CN V: reduced right V1-V3 CN VII: upper and lower face symmetric CN VIII: hearing intact CN IX, X: gag intact, uvula midline CN XI: sternocleidomastoid and trapezius muscles intact CN XII: tongue midline Bulk & Tone: normal, no fasciculations. Motor:  muscle strength 5/5 throughout Sensation:  Reduced vibration sensation in toe of right foot.  Otherwise, pinprick, temperature and vibratory sensation intact. Deep Tendon Reflexes:  2+ throughout,  toes downgoing.   Finger to nose testing:  Without dysmetria.   Heel to shin:  Without dysmetria.   Gait:  Normal station and stride.  Romberg negative.    Thank you for allowing me to take part in the care of this patient.  , DO  CC: Shon Millet, MD

## 2021-04-05 ENCOUNTER — Other Ambulatory Visit: Payer: Self-pay

## 2021-04-05 ENCOUNTER — Other Ambulatory Visit (INDEPENDENT_AMBULATORY_CARE_PROVIDER_SITE_OTHER): Payer: No Typology Code available for payment source

## 2021-04-05 ENCOUNTER — Ambulatory Visit (INDEPENDENT_AMBULATORY_CARE_PROVIDER_SITE_OTHER): Payer: No Typology Code available for payment source | Admitting: Neurology

## 2021-04-05 ENCOUNTER — Encounter: Payer: Self-pay | Admitting: Neurology

## 2021-04-05 VITALS — BP 119/73 | HR 81 | Ht 62.0 in | Wt 178.2 lb

## 2021-04-05 DIAGNOSIS — H469 Unspecified optic neuritis: Secondary | ICD-10-CM

## 2021-04-05 LAB — SEDIMENTATION RATE: Sed Rate: 45 mm/hr — ABNORMAL HIGH (ref 0–20)

## 2021-04-05 NOTE — Patient Instructions (Addendum)
I would like to add an MRI of the cervical spine with and without contrast. [facility] at 04/10/21 at 5 pm arrive at 4:30 pm [place] office Check blood work: ANA, sed rate, ACE, NMO antibodies.Your provider has requested that you have labwork completed today. Please go to [facility] ([id number]) on the second floor of this building before leaving the office today. You do not need to check in. If you are not called within 15 minutes please check with the front desk.   Will schedule spinal tap to check spinal fluid cell count, gram stain and culture, protein, glucose, cytology, oligoclonal bands, IgG index, ACE. [facility] 04/14/21 at 9 am arrive an hour early must have a driver. Will be there for at least 3 hours. Pt to lay flat and take it easy after procedure.  Follow up after testing.  Further recommendations pending results.

## 2021-04-06 NOTE — Progress Notes (Signed)
PA Started Per Pam No Precert Required.For Spinal Tap, MRI Cervical Spine W/WO Contrast.   Call  Ref # [id number]

## 2021-04-07 LAB — NEUROMYELITIS OPTICA AUTOAB, IGG: NMO IgG Autoantibodies: <1.5 U/mL (ref 0.0–3.0)

## 2021-04-09 LAB — ANTI-NUCLEAR AB-TITER (ANA TITER): ANA Titer 1: 1:40 {titer} — ABNORMAL HIGH

## 2021-04-09 LAB — ANGIOTENSIN CONVERTING ENZYME: Angiotensin-Converting Enzyme: 22 U/L (ref 9–67)

## 2021-04-09 LAB — ANA: Anti Nuclear Antibody (ANA): POSITIVE — AB

## 2021-04-11 ENCOUNTER — Telehealth: Payer: Self-pay

## 2021-04-11 ENCOUNTER — Other Ambulatory Visit (INDEPENDENT_AMBULATORY_CARE_PROVIDER_SITE_OTHER): Payer: No Typology Code available for payment source

## 2021-04-11 ENCOUNTER — Other Ambulatory Visit: Payer: Self-pay

## 2021-04-11 DIAGNOSIS — H469 Unspecified optic neuritis: Secondary | ICD-10-CM

## 2021-04-11 NOTE — Telephone Encounter (Signed)
Patient advised of her MRI Brain and orbits. MRI Cervical not received yet.   Labs ordered.

## 2021-04-11 NOTE — Telephone Encounter (Signed)
-----   Message from Drema Dallas, DO sent at 04/11/2021  7:42 AM EST ----- The sed rate, which is a nonspecific marker for inflammation, is mildly elevated.  The ANA, also a nonspecific marker for inflammation, is only borderline positive.  Therefore, these labs may not be clinically relevant.  However, I would like to check ds-DNA and RF.  I also reviewed the repeat MRIs which were normal.  I want to make sure that the MRI of the cervical spine with and without contrast is scheduled.

## 2021-04-12 LAB — RHEUMATOID FACTOR: Rheumatoid fact SerPl-aCnc: <14 IU/mL (ref <14)

## 2021-04-13 ENCOUNTER — Other Ambulatory Visit: Payer: Self-pay

## 2021-04-13 DIAGNOSIS — H469 Unspecified optic neuritis: Secondary | ICD-10-CM

## 2021-04-13 NOTE — Progress Notes (Signed)
PER Tiffany at [facility] please reorder LP.  LP Reordered.

## 2021-04-14 ENCOUNTER — Encounter: Payer: Self-pay | Admitting: Neurology

## 2021-04-14 LAB — ANA+ENA+DNA/DS+SCL 70+SJOSSA/B
ANA Titer 1: NEGATIVE
ENA RNP Ab: 2.1 AI — ABNORMAL HIGH (ref 0.0–0.9)
ENA SM Ab Ser-aCnc: <0.2 AI (ref 0.0–0.9)
ENA SSA (RO) Ab: <0.2 AI (ref 0.0–0.9)
ENA SSB (LA) Ab: <0.2 AI (ref 0.0–0.9)
Scleroderma (Scl-70) (ENA) Antibody, IgG: <0.2 AI (ref 0.0–0.9)
dsDNA Ab: 1 IU/mL (ref 0–9)

## 2021-04-24 ENCOUNTER — Telehealth: Payer: Self-pay | Admitting: Neurology

## 2021-04-24 NOTE — Telephone Encounter (Signed)
Pt called back no answer left a voice mail to call the office back  

## 2021-04-24 NOTE — Telephone Encounter (Signed)
Pt stated that when she is laying down she is ok but when she is sitting up its a dull pain that goes into a stabbing pain where she had her LP on 04/14/21.

## 2021-04-24 NOTE — Telephone Encounter (Signed)
Pt called to see what kind of pain she was having? No answer left a voice to call the office back

## 2021-04-24 NOTE — Telephone Encounter (Signed)
Pt called in stating she is still having some pain after her spinal tap.

## 2021-04-25 ENCOUNTER — Other Ambulatory Visit: Payer: Self-pay | Admitting: Neurology

## 2021-04-25 ENCOUNTER — Encounter: Payer: Self-pay | Admitting: Neurology

## 2021-04-25 MED ORDER — CYCLOBENZAPRINE HCL 10 MG PO TABS: 10.0000 mg | ORAL_TABLET | Freq: Three times a day (TID) | ORAL | 3 refills | Status: AC | PRN

## 2021-04-25 NOTE — Telephone Encounter (Signed)
Flexeril 10mg  three times daily as needed sent to pharmacy

## 2021-04-25 NOTE — Telephone Encounter (Signed)
Pt would like to have the muscle relaxer called in and be placed on the wait list for an appointment I will let the front desk know to add pt to the wait list

## 2021-04-25 NOTE — Telephone Encounter (Signed)
Pt is returning a call to someone. Said please call [phone]

## 2021-04-27 NOTE — Progress Notes (Signed)
NEUROLOGY FOLLOW UP OFFICE NOTE  Sydna Thackery [id number]  Assessment/Plan:   Right ocular pain and vision loss - semiology consistent with optic neuritis, however complete neurological workup is negative based on eye exam, MRI brain/orbits/spinal cord with contrast as well as CSF analysis and serum labs.  ANA was initially borderline positive but repeat ANA was negative with ENA panel revealing only elevated RNP antibody, which is of unknown clinical significance but could represent underlying systemic autoimmune disease (such as mixed connective tissue disease).  Ultimately, as there are no objective test results to corroborate her symptoms, I am unsure of her diagnosis.  Query underlying systemic autoimmune disease.  She had a positive RNP antibody of unknown clinical significance.  She needs to be evaluated by neuro-ophthalmology.  Her ophthalmologist has referred her and she has an appointment later this month.  I asked that she have the neuro-ophthalmologist fax his findings to my office.  Further recommendations pending results.  Follow up after testing.  Subjective:  Symmone Elizarraraz is a 25 year old right-handed female with migraines and Bipolar disorder who follows up for visual disturbance.  MRI of cervical spine personally reviewed.  UPDATE: She underwent further neurologic workup which was unremarkable: 04/05/2021 SERUM:  NMO antibody negative, mildly elevated sed rate 45, negative RF, ANA borderline positive with 1:40 titer nuclear, speckled,  Follow up ANA with ENA panel on 04/11/2021 demonstrated negative ANA and ENA with positive RNP ab 2.1 but otherwise negative 04/10/2021 MRI C-SPINE W/WO:  Normal 04/14/2021 CSF ANALYSIS:  cell count negative, protein 21, glucose 59, 0 oligoclonal bands, IgG index 2, negative, gram stain and culture, negative cytology, and negative ACE  She continues to have vision deficits in the right eye.  She particularly can't see in the dark.  She also has  started seeing flashes as well, also more prominent in the dark.  No headaches.     HISTORY: Around 03/06/2021, she became sick with nausea and vomiting.  Afterwards, she developed blurred vision involving the nasal aspect of her right eye.  Over the next couple of days, it progressed to involve the entire right visual field as well as color desaturation.  She also reported right ocular pain and right sided headache.  She went to the ED on 03/09/2021 for further evaluation.  MRI of brain and orbits with and without contrast personally reviewed were unremarkable for evidence of MS or optic neuritis.  MRI of thoracic spine was unremarkable.  She saw ophthalmology on 03/17/2021 demonstrated OD nasal depression respecting the vertical midline on HVF but no evidence of optic nerve or disc edema.  She is on a small dose of methylprednislone for skin condition.     FH: migraines but not MS  PAST MEDICAL HISTORY: Past Medical History:  Diagnosis Date   Anxiety    Fracture of upper end of left tibia 07/06/2014   fell while skateboarding   Insomnia    Mental disorder    bipolar   Migraines     MEDICATIONS: Current Outpatient Medications on File Prior to Visit  Medication Sig Dispense Refill   cyclobenzaprine (FLEXERIL) 10 MG tablet Take 1 tablet (10 mg total) by mouth 3 (three) times daily as needed for muscle spasms (caution for drowsiness). 90 tablet 3   dicyclomine (BENTYL) 20 MG tablet Take 20 mg by mouth 3 (three) times daily as needed for spasms.      ketorolac (ACULAR) 0.5 % ophthalmic solution Place 1 drop into the right eye 4 (four)  times daily.     methylPREDNISolone (MEDROL) 4 MG tablet Take 4 mg by mouth daily.     pantoprazole (PROTONIX) 40 MG tablet Take 40 mg by mouth daily.     risperiDONE (RISPERDAL) 0.25 MG tablet Take 0.25 mg by mouth at bedtime.     [DISCONTINUED] norgestimate-ethinyl estradiol (ORTHO-CYCLEN,SPRINTEC,PREVIFEM) 0.25-35 MG-MCG tablet Take 1 tablet by mouth daily. 1  Package 11   [DISCONTINUED] omeprazole (PRILOSEC) 20 MG capsule Take 1 capsule (20 mg total) by mouth daily. 30 capsule 0   [DISCONTINUED] promethazine (PHENERGAN) 12.5 MG tablet Take 12.5 mg by mouth every 6 (six) hours as needed for nausea or vomiting.      [DISCONTINUED] verapamil (VERELAN PM) 120 MG 24 hr capsule Take 120 mg by mouth at bedtime.     No current facility-administered medications on file prior to visit.    ALLERGIES: Allergies  Allergen Reactions   Ibuprofen Nausea And Vomiting    Other reaction(s): Abdominal Pain   Tamiflu [Oseltamivir Phosphate] Nausea And Vomiting and Rash   Dexamethasone Hives   Flagyl [Metronidazole] Rash   Latex Other (See Comments)    Makes skin raw   Nickel Rash   Oxycodone Nausea And Vomiting   Wellbutrin [Bupropion] Nausea Only and Other (See Comments)    Patient had insomnia and nausea and she could not    FAMILY HISTORY: Family History  Problem Relation Age of Onset   Migraines Paternal Grandmother    Dementia Paternal Grandmother    Stroke Paternal Grandfather       Objective:  Blood pressure 127/72, pulse 92, height 5\' 2"  (1.575 m), weight 180 lb 3.2 oz (81.7 kg), SpO2 98 %. General: No acute distress.  Patient appears well-groomed.    , DO  CC: Shon Millet, MD

## 2021-04-28 ENCOUNTER — Encounter: Payer: Self-pay | Admitting: Neurology

## 2021-04-28 ENCOUNTER — Other Ambulatory Visit: Payer: Self-pay

## 2021-04-28 ENCOUNTER — Ambulatory Visit (INDEPENDENT_AMBULATORY_CARE_PROVIDER_SITE_OTHER): Payer: No Typology Code available for payment source | Admitting: Neurology

## 2021-04-28 VITALS — BP 127/72 | HR 92 | Ht 62.0 in | Wt 180.2 lb

## 2021-04-28 DIAGNOSIS — H5461 Unqualified visual loss, right eye, normal vision left eye: Secondary | ICD-10-CM | POA: Diagnosis not present

## 2021-04-28 NOTE — Patient Instructions (Signed)
The neurologic workup has been negative.  No findings to suggest optic neuritis or MS.  The next thing would be the neuro-ophthalmology exam.  Let me know when you have seen the neuro-ophthalmologist and ask him to fax his note to me (my fax is [phone]).  I really won't have further recommendations until you see him.  Follow up next available (of course I will get you in sooner if needed).

## 2021-05-03 ENCOUNTER — Telehealth: Payer: Self-pay | Admitting: Neurology

## 2021-05-03 DIAGNOSIS — R768 Other specified abnormal immunological findings in serum: Secondary | ICD-10-CM

## 2021-05-03 NOTE — Telephone Encounter (Signed)
Pt called back in, got [employer]. She stated she does not have a preference on a particular rheumatology office. Whichever Dr. Everlena Cooper prefers.

## 2021-05-03 NOTE — Telephone Encounter (Signed)
Pt said she spoke with her rhematory doctor and they want her to go ahead and get referral sent over.  Dr.Glenn

## 2021-05-03 NOTE — Telephone Encounter (Signed)
Tried calling Patient, no answer. LMOVM to call the office back.  Please send referral to rheumatology of her choice for positive ANA and possible mixed connective tissue disease

## 2021-05-03 NOTE — Telephone Encounter (Signed)
Pt called left a voice mail that [facility] they will call you to get you set up with an appointment

## 2021-05-04 ENCOUNTER — Telehealth: Payer: Self-pay | Admitting: Internal Medicine

## 2021-05-04 NOTE — Telephone Encounter (Signed)
Request received to transfer GI care from outside practice to [facility].  We appreciate the interest in our practice, however at this time due to high demand from patients without established GI providers we cannot accommodate this transfer.  Ability to accommodate future transfer requests may change over time and the patient can contact us again in 6-12 months if still interested in being seen at [facility].      °

## 2021-05-04 NOTE — Telephone Encounter (Signed)
Hi Dr. Rhea Belton,   D.O.D  Patient called to request a transfer of care from [facility] over to [facility] due to location said [place] is just too far for her. Records are available in epic for you to review and advise on scheduling.    Thanks

## 2021-05-05 NOTE — Telephone Encounter (Signed)
Called patient to advise her of recommendations below. Patient voiced understanding and said she will call back.

## 2021-05-09 ENCOUNTER — Telehealth: Payer: Self-pay | Admitting: Neurology

## 2021-05-09 NOTE — Telephone Encounter (Signed)
Per pt she was scheduled about ten minutes.

## 2021-05-09 NOTE — Telephone Encounter (Signed)
Pt called in stating she spoke with [facility] and they stated they do not have a referral for her. She would like for it to be sent again.

## 2021-05-23 ENCOUNTER — Encounter: Payer: Self-pay | Admitting: Neurology

## 2021-06-23 NOTE — Progress Notes (Signed)
Office Visit Note  Patient: Vanessa Nelson             Date of Birth: Dec 13, 1995           MRN: [id number]             PCP: Alma Friendly, MD Referring: Pieter Partridge, DO Visit Date: 07/07/2021 Occupation: _0 @  Subjective:  No chief complaint on file.   History of Present Illness: Vanessa Nelson is a 26 y.o. female seen in consultation per request of Dr. Tomi Likens.  According to the patient in 2019 she started experiencing increased hair loss increased sweating and IBS flare.  She states she had amenorrhea for about 9 months.  She also had unintentional weight loss of 70 pounds for 6 months.  She states she had extensive work-up at the time.  Her TSH was normal she had MRI of her brain which showed enlarged pituitary.  She was seen by an endocrinologist and was taken off oral contraceptive pickles and Lamictal.  She states 6 months.  She noted some improvement in her symptoms and she tolerated her symptoms over the years.  In February 2022 she developed a rash on her neck which gradually spread to her shoulders and started attacking her tattoos.  She states she use topical cream without much help and then in August 2022 she was evaluated by dermatologist in [place] who diagnosed it to be contact dermatitis and advised her to use Zyrtec and hydrocortisone cream.  She states the rash resolved with the topical treatment.  In October 2022 she developed right eye tunnel vision and blurred vision.  She states the rash recurred.  She was seen by an ophthalmologist locally and then by a neuro-ophthalmologist and noticed diagnosis was established.  She was referred to Dr. Tomi Likens who did extensive work-up including a spinal tap and serology.  Her ANA was 1: 40 initially and then repeat ANA was negative but her RNP was positive.  She states she continues to have poor vision in her right eye and rash persists  She also suffers from hair loss.  She states this week she has been experiencing increased joint pain  and muscle pain.  She has not seen any joint swelling.  There is no history of raynaud's phenominon, oral ulcers, sicca symptoms.  She gives history of intermittent lymphadenopathy in her cervical region.  This history of psoriasis in her mother and Crohn's disease in her paternal first cousin.  She is not using any current oral contraceptive pills.  She is sexually active but has only female sexual partner.  Activities of Daily Living:  Patient reports morning stiffness for all day. Patient Reports nocturnal pain.  Difficulty dressing/grooming: Denies Difficulty climbing stairs: Reports Difficulty getting out of chair: Reports Difficulty using hands for taps, buttons, cutlery, and/or writing: Reports  Review of Systems  Constitutional:  Positive for fatigue.  HENT:  Negative for mouth sores, mouth dryness and nose dryness.   Eyes:  Positive for pain and visual disturbance. Negative for itching and dryness.  Respiratory:  Negative for shortness of breath and difficulty breathing.   Cardiovascular:  Positive for chest pain. Negative for palpitations.  Gastrointestinal:  Positive for constipation and diarrhea. Negative for blood in stool.  Endocrine: Negative for increased urination.  Genitourinary:  Negative for difficulty urinating.  Musculoskeletal:  Positive for joint pain, joint pain, myalgias, morning stiffness, muscle tenderness and myalgias. Negative for joint swelling.  Skin:  Positive for rash, hair loss and sensitivity  to sunlight. Negative for color change.  Allergic/Immunologic: Positive for susceptible to infections.  Neurological:  Positive for numbness, headaches and weakness. Negative for dizziness and memory loss.  Hematological:  Positive for bruising/bleeding tendency and swollen glands.  Psychiatric/Behavioral:  Positive for depressed mood and sleep disturbance. The patient is nervous/anxious.    PMFS History:  Patient Active Problem List   Diagnosis Date Noted   Pain,  female pelvic 07/01/2017   Bipolar 1 disorder, depressed, mild ([place]) 10/04/2016    Class: Chronic   MDD (major depressive disorder), recurrent severe, without psychosis ([place]) 09/26/2016    Past Medical History:  Diagnosis Date   Anxiety    Fracture of upper end of left tibia 07/06/2014   fell while skateboarding   Insomnia    Mental disorder    bipolar   Migraines     Family History  Problem Relation Age of Onset   Endometriosis Mother    Pancreatic disease Father    Healthy Sister    Migraines Paternal Grandmother    Dementia Paternal Grandmother    Stroke Paternal Grandfather    Past Surgical History:  Procedure Laterality Date   CHONDROPLASTY Left 07/09/2014   Procedure: CHONDROPLASTY;  Surgeon: Renette Butters, MD;  Location: [place];  Service: Orthopedics;  Laterality: Left;   DENTAL SURGERY     for impacted teeth   KNEE ARTHROSCOPY Left 07/09/2014   Procedure: ARTHROSCOPY KNEE;  Surgeon: Renette Butters, MD;  Location: [place];  Service: Orthopedics;  Laterality: Left;   ORIF TIBIA PLATEAU Left 07/09/2014   Procedure: OPEN REDUCTION INTERNAL FIXATION (ORIF) TIBIAL PLATEAU;  Surgeon: Renette Butters, MD;  Location: [place];  Service: Orthopedics;  Laterality: Left;   TONSILLECTOMY     age 47   UPPER GI ENDOSCOPY  09/10/2019   Social History   Social History Narrative   Not on file    There is no immunization history on file for this patient.   Objective: Vital Signs: BP 117/75 (BP Location: Right Arm, Patient Position: Sitting, Cuff Size: Large)    Pulse 91    Ht 5' 2" (1.575 m)    Wt 189 lb 9.6 oz (86 kg)    BMI 34.68 kg/m    Physical Exam Vitals and nursing note reviewed.  Constitutional:      Appearance: She is well-developed.  HENT:     Head: Normocephalic and atraumatic.  Eyes:     Conjunctiva/sclera: Conjunctivae normal.  Cardiovascular:     Rate and Rhythm: Normal rate and regular rhythm.      Heart sounds: Normal heart sounds.  Pulmonary:     Effort: Pulmonary effort is normal.     Breath sounds: Normal breath sounds.  Abdominal:     General: Bowel sounds are normal.     Palpations: Abdomen is soft.  Musculoskeletal:     Cervical back: Normal range of motion.  Lymphadenopathy:     Cervical: No cervical adenopathy.  Skin:    General: Skin is warm and dry.     Capillary Refill: Capillary refill takes less than 2 seconds.     Comments: Fine erythematous papular lesions were noted at the nape of the neck and over the medial aspect of her right arm with excoriation marks.  Neurological:     Mental Status: She is alert and oriented to person, place, and time.  Psychiatric:        Behavior: Behavior normal.  Musculoskeletal Exam: C-spine thoracic and lumbar spine with good range of motion.  Shoulder joints, elbow joints, wrist joints, MCPs PIPs and DIPs with good range of motion with no synovitis.  Hip joints, knee joints, ankles, MTPs and PIPs with good range of motion with no synovitis.  She tenderness on palpation over wrist joints PIPs.  She also had discomfort range of motion of her knee joints.  There was no tenderness over ankles or MTPs.  She had generalized hyperalgesia and positive tender points.  CDAI Exam: CDAI Score: -- Patient Global: --; Provider Global: -- Swollen: --; Tender: -- Joint Exam 07/07/2021   No joint exam has been documented for this visit   There is currently no information documented on the homunculus. Go to the Rheumatology activity and complete the homunculus joint exam.  Investigation: No additional findings.  Imaging: No results found.  Recent Labs: Lab Results  Component Value Date   WBC 8.1 03/09/2021   HGB 15.5 (H) 03/09/2021   PLT 333 03/09/2021   NA 136 03/09/2021   K 3.7 03/09/2021   CL 100 03/09/2021   CO2 26 03/09/2021   GLUCOSE 94 03/09/2021   BUN 8 03/09/2021   CREATININE 0.76 03/09/2021   BILITOT 0.5 03/09/2021    ALKPHOS 92 03/09/2021   AST 27 03/09/2021   ALT 31 03/09/2021   PROT 8.1 03/09/2021   ALBUMIN 4.1 03/09/2021   CALCIUM 10.2 03/09/2021   GFRAA >60 07/25/2018   04/05/2021 SERUM:  NMO antibody negative, mildly elevated sed rate 45, negative RF, ANA borderline positive with 1:40 titer nuclear, speckled,  Follow up ANA with ENA panel on 04/11/2021 demonstrated negative ANA and ENA with positive RNP ab 2.1 but otherwise negative 04/10/2021 MRI C-SPINE W/WO:  Normal 04/14/2021 CSF ANALYSIS:  cell count negative, protein 21, glucose 59, 0 oligoclonal bands, IgG index 2, negative, gram stain and culture, negative cytology, and negative ACE   Speciality Comments: No specialty comments available.  Procedures:  No procedures performed Allergies: Ibuprofen, Tamiflu [oseltamivir phosphate], Dexamethasone, Flagyl [metronidazole], Latex, Nickel, Oxycodone, and Wellbutrin [bupropion]   Assessment / Plan:     Visit Diagnoses: Positive ANA (antinuclear antibody) - 04/05/21: ANA 1:40NS, ACE 22, ESR 45. 04/11/21: ANA negative, dsDNA 1, RNP 2.1, Scl-70-, Ro-, La-,  -patient was referred to me for evaluation of possible autoimmune disease.  Her ANA is negative.  RNP is positive.  She has no typical features of autoimmune disease.  She gives history of rash which appears to be contact dermatitis.  She also gives history of hair thinning.  She has had recent visual disturbance for which she is seen ophthalmologist and neuro-ophthalmologist.  She had extensive neurological work-up.  She complains of increased muscle pain and joint pain for several years which has been increased in the last few weeks.  No synovitis was noted on the examination.  I will obtain additional labs today and repeat RNP.  Plan: Urinalysis, Routine w reflex microscopic, Sedimentation rate, RNP Antibody, C3 and C4  Rash-rash noted on her neck and right arm appears to be consistent with contact dermatitis.  Hair loss-she gives history of hair  loss over the last few years.  No patchy alopecia was noted.  Visual disturbance -patient gives history of right visual Ioss, tunnel vision and blurred vision.  She has seen several ophthalmologist and neuro-ophthalmology.  I will refer her to Dr. Manuella Ghazi to evaluate for autoimmune etiology.  Plan: Ambulatory referral to Ophthalmology, HLA-B27 antigen  Pain in both hands -she  complains of discomfort in her hands for many years.  No warmth swelling or effusion was noted.  Plan: XR Hand 2 View Right, XR Hand 2 View Left, x-rays of bilateral hands were unremarkable.  X-ray findings were discussed with the patient.  Cyclic citrul peptide antibody, IgG  Chronic pain of both knees -she complains of discomfort in her knee joints.  No warmth swelling or effusion was noted.  Plan: XR KNEE 3 VIEW RIGHT, XR KNEE 3 VIEW LEFT.  X-rays of bilateral knee joints were unremarkable.  X-ray findings were discussed with the patient.  Polyarthralgia-she complains of pain and discomfort in all of her joints.  She had generalized hyperalgesia, mild hypermobility.  My suspicion is that she has fibromyalgia syndrome.  Myalgia -she complains of muscle pain for many years.  She has generalized hyperalgesia, positive tender points consistent with fibromyalgia syndrome.  Plan: CK  Other fatigue-she has history of fatigue for many years now.  Primary insomnia-she has history of chronic insomnia.  She states insomnia is better with medications.  Bipolar 1 disorder, depressed, mild (HCC)  History of gastroesophageal reflux (GERD)  Irritable bowel syndrome with both constipation and diarrhea-it is not unusual to see IBS with fibromyalgia.  Family history of psoriasis in mother  Family history of Crohn's disease - Paternal first cousin  Orders: Orders Placed This Encounter  Procedures   XR Hand 2 View Right   XR Hand 2 View Left   XR KNEE 3 VIEW RIGHT   XR KNEE 3 VIEW LEFT   Urinalysis, Routine w reflex microscopic    CK   Sedimentation rate   RNP Antibody   C3 and C4   Cyclic citrul peptide antibody, IgG   HLA-B27 antigen   Ambulatory referral to Ophthalmology   No orders of the defined types were placed in this encounter.    Follow-Up Instructions: No follow-ups on file.   Bo Merino, MD  Note - This record has been created using Editor, commissioning.  Chart creation errors have been sought, but may not always  have been located. Such creation errors do not reflect on  the standard of medical care.

## 2021-07-07 ENCOUNTER — Ambulatory Visit: Payer: Self-pay

## 2021-07-07 ENCOUNTER — Other Ambulatory Visit: Payer: Self-pay

## 2021-07-07 ENCOUNTER — Encounter: Payer: Self-pay | Admitting: Rheumatology

## 2021-07-07 ENCOUNTER — Ambulatory Visit (INDEPENDENT_AMBULATORY_CARE_PROVIDER_SITE_OTHER): Payer: No Typology Code available for payment source | Admitting: Rheumatology

## 2021-07-07 ENCOUNTER — Ambulatory Visit (INDEPENDENT_AMBULATORY_CARE_PROVIDER_SITE_OTHER): Payer: No Typology Code available for payment source

## 2021-07-07 VITALS — BP 117/75 | HR 91 | Ht 62.0 in | Wt 189.6 lb

## 2021-07-07 DIAGNOSIS — R21 Rash and other nonspecific skin eruption: Secondary | ICD-10-CM

## 2021-07-07 DIAGNOSIS — M79642 Pain in left hand: Secondary | ICD-10-CM

## 2021-07-07 DIAGNOSIS — M791 Myalgia, unspecified site: Secondary | ICD-10-CM

## 2021-07-07 DIAGNOSIS — M25561 Pain in right knee: Secondary | ICD-10-CM | POA: Diagnosis not present

## 2021-07-07 DIAGNOSIS — F3131 Bipolar disorder, current episode depressed, mild: Secondary | ICD-10-CM

## 2021-07-07 DIAGNOSIS — M2550 Pain in unspecified joint: Secondary | ICD-10-CM

## 2021-07-07 DIAGNOSIS — G8929 Other chronic pain: Secondary | ICD-10-CM | POA: Diagnosis not present

## 2021-07-07 DIAGNOSIS — F332 Major depressive disorder, recurrent severe without psychotic features: Secondary | ICD-10-CM

## 2021-07-07 DIAGNOSIS — M79641 Pain in right hand: Secondary | ICD-10-CM

## 2021-07-07 DIAGNOSIS — M25562 Pain in left knee: Secondary | ICD-10-CM

## 2021-07-07 DIAGNOSIS — F5101 Primary insomnia: Secondary | ICD-10-CM

## 2021-07-07 DIAGNOSIS — R768 Other specified abnormal immunological findings in serum: Secondary | ICD-10-CM | POA: Diagnosis not present

## 2021-07-07 DIAGNOSIS — L659 Nonscarring hair loss, unspecified: Secondary | ICD-10-CM

## 2021-07-07 DIAGNOSIS — Z8719 Personal history of other diseases of the digestive system: Secondary | ICD-10-CM

## 2021-07-07 DIAGNOSIS — H539 Unspecified visual disturbance: Secondary | ICD-10-CM

## 2021-07-07 DIAGNOSIS — Z8379 Family history of other diseases of the digestive system: Secondary | ICD-10-CM

## 2021-07-07 DIAGNOSIS — R5383 Other fatigue: Secondary | ICD-10-CM

## 2021-07-07 DIAGNOSIS — K582 Mixed irritable bowel syndrome: Secondary | ICD-10-CM

## 2021-07-07 DIAGNOSIS — Z840 Family history of diseases of the skin and subcutaneous tissue: Secondary | ICD-10-CM

## 2021-07-09 NOTE — Progress Notes (Signed)
UA is probably not a clean-catch.  If patient is experiencing symptoms of urinary tract infection or yeast infection then she should see her PCP.

## 2021-07-10 ENCOUNTER — Encounter: Payer: Self-pay | Admitting: Neurology

## 2021-07-11 LAB — URINALYSIS, ROUTINE W REFLEX MICROSCOPIC
Bilirubin Urine: NEGATIVE
Glucose, UA: NEGATIVE
Hgb urine dipstick: NEGATIVE
Hyaline Cast: NONE SEEN /LPF
Ketones, ur: NEGATIVE
Nitrite: NEGATIVE
Protein, ur: NEGATIVE
RBC / HPF: NONE SEEN /HPF (ref 0–2)
Specific Gravity, Urine: 1.02 (ref 1.001–1.035)
pH: 7.5 (ref 5.0–8.0)

## 2021-07-11 LAB — C3 AND C4
C3 Complement: 172 mg/dL (ref 83–193)
C4 Complement: 30 mg/dL (ref 15–57)

## 2021-07-11 LAB — CK: Total CK: 69 U/L (ref 29–143)

## 2021-07-11 LAB — HLA-B27 ANTIGEN: HLA-B27 Antigen: NEGATIVE

## 2021-07-11 LAB — RNP ANTIBODY: Ribonucleic Protein(ENA) Antibody, IgG: 2.1 AI — AB

## 2021-07-11 LAB — MICROSCOPIC MESSAGE

## 2021-07-11 LAB — SEDIMENTATION RATE: Sed Rate: 29 mm/h — ABNORMAL HIGH (ref 0–20)

## 2021-07-11 LAB — CYCLIC CITRUL PEPTIDE ANTIBODY, IGG: Cyclic Citrullin Peptide Ab: <16 UNITS

## 2021-07-11 NOTE — Progress Notes (Signed)
I will discuss results at the follow-up visit.

## 2021-07-24 NOTE — Progress Notes (Deleted)
? ?Office Visit Note ? ?Patient: Vanessa Nelson             ?Date of Birth: 10-06-1995           ?MRN: [id number]             ?PCP: Alma Friendly, MD ?Referring: Alma Friendly, MD ?Visit Date: 08/03/2021 ?Occupation: '@GUARO[employer]' @ ? ?Subjective:  ?No chief complaint on file. ? ? ?History of Present Illness: Vanessa Nelson is a 26 y.o. female ***  ? ?Activities of Daily Living:  ?Patient reports morning stiffness for *** {minute/hour:19697}.   ?Patient {ACTIONS;DENIES/REPORTS:21021675::"Denies"} nocturnal pain.  ?Difficulty dressing/grooming: {ACTIONS;DENIES/REPORTS:21021675::"Denies"} ?Difficulty climbing stairs: {ACTIONS;DENIES/REPORTS:21021675::"Denies"} ?Difficulty getting out of chair: {ACTIONS;DENIES/REPORTS:21021675::"Denies"} ?Difficulty using hands for taps, buttons, cutlery, and/or writing: {ACTIONS;DENIES/REPORTS:21021675::"Denies"} ? ?No Rheumatology ROS completed.  ? ?PMFS History:  ?Patient Active Problem List  ? Diagnosis Date Noted  ? Pain, female pelvic 07/01/2017  ? Bipolar 1 disorder, depressed, mild ([place]) 10/04/2016  ?  Class: Chronic  ? MDD (major depressive disorder), recurrent severe, without psychosis ([place]) 09/26/2016  ?  ?Past Medical History:  ?Diagnosis Date  ? Anxiety   ? Fracture of upper end of left tibia 07/06/2014  ? fell while skateboarding  ? Insomnia   ? Mental disorder   ? bipolar  ? Migraines   ?  ?Family History  ?Problem Relation Age of Onset  ? Endometriosis Mother   ? Pancreatic disease Father   ? Healthy Sister   ? Migraines Paternal Grandmother   ? Dementia Paternal Grandmother   ? Stroke Paternal Grandfather   ? ?Past Surgical History:  ?Procedure Laterality Date  ? CHONDROPLASTY Left 07/09/2014  ? Procedure: CHONDROPLASTY;  Surgeon: Renette Butters, MD;  Location: [facility];  Service: Orthopedics;  Laterality: Left;  ? DENTAL SURGERY    ? for impacted teeth  ? KNEE ARTHROSCOPY Left 07/09/2014  ? Procedure: ARTHROSCOPY KNEE;  Surgeon: Renette Butters, MD;   Location: [facility];  Service: Orthopedics;  Laterality: Left;  ? ORIF TIBIA PLATEAU Left 07/09/2014  ? Procedure: OPEN REDUCTION INTERNAL FIXATION (ORIF) TIBIAL PLATEAU;  Surgeon: Renette Butters, MD;  Location: [facility];  Service: Orthopedics;  Laterality: Left;  ? TONSILLECTOMY    ? age 38  ? UPPER GI ENDOSCOPY  09/10/2019  ? ?Social History  ? ?Social History Narrative  ? Not on file  ? ? ?There is no immunization history on file for this patient.  ? ?Objective: ?Vital Signs: There were no vitals taken for this visit.  ? ?Physical Exam  ? ?Musculoskeletal Exam: *** ? ?CDAI Exam: ?CDAI Score: -- ?Patient Global: --; Provider Global: -- ?Swollen: --; Tender: -- ?Joint Exam 08/03/2021  ? ?No joint exam has been documented for this visit  ? ?There is currently no information documented on the homunculus. Go to the Rheumatology activity and complete the homunculus joint exam. ? ?Investigation: ?No additional findings. ? ?Imaging: ?XR Hand 2 View Left ? ?Result Date: 07/07/2021 ?No CMC, MCP, PIP or DIP narrowing was noted.  No intercarpal or radiocarpal joint space narrowing was noted.  No erosive changes were noted. Impression: Unremarkable x-ray of the hand. No CMC, MCP, PIP or DIP narrowing was noted.  No intercarpal or radiocarpal joint space narrowing was noted.  No erosive changes were noted. Impression: Unremarkable x-ray of the hand.  ? ?XR Hand 2 View Right ? ?Result Date: 07/07/2021 ?No CMC, MCP, PIP or DIP narrowing was noted.  No intercarpal or radiocarpal joint  space narrowing was noted.  No erosive changes were noted. Impression: Unremarkable x-ray of the hand.  ? ?XR KNEE 3 VIEW LEFT ? ?Result Date: 07/07/2021 ?The medial lateral compartment narrowing was noted.  No patellofemoral narrowing was noted.  No chondrocalcinosis was noted. Impression: Unremarkable x-ray of the knee joint. ? ?XR KNEE 3 VIEW RIGHT ? ?Result Date: 07/07/2021 ?No medial or lateral compartment  narrowing was noted.  No patellofemoral narrowing was noted.  No chondrocalcinosis was noted.  An area of sclerosis was noted over the lateral aspect of tibia.  Patient had previous arthroscopic surgery. Impression: Unremarkable x-ray of the knee joint.  ? ?Recent Labs: ?Lab Results  ?Component Value Date  ? WBC 8.1 03/09/2021  ? HGB 15.5 (H) 03/09/2021  ? PLT 333 03/09/2021  ? NA 136 03/09/2021  ? K 3.7 03/09/2021  ? CL 100 03/09/2021  ? CO2 26 03/09/2021  ? GLUCOSE 94 03/09/2021  ? BUN 8 03/09/2021  ? CREATININE 0.76 03/09/2021  ? BILITOT 0.5 03/09/2021  ? ALKPHOS 92 03/09/2021  ? AST 27 03/09/2021  ? ALT 31 03/09/2021  ? PROT 8.1 03/09/2021  ? ALBUMIN 4.1 03/09/2021  ? CALCIUM 10.2 03/09/2021  ? GFRAA >60 07/25/2018  ? ?July 07, 2021 UA showed many bacteria and white cells, RNP 2.1, C3-C4 normal, anti-CCP negative, HLA-B27 negative, CK 69, sed rate 29 ?Speciality Comments: No specialty comments available. ? ?Procedures:  ?No procedures performed ?Allergies: Ibuprofen, Tamiflu [oseltamivir phosphate], Dexamethasone, Flagyl [metronidazole], Latex, Nickel, Oxycodone, and Wellbutrin [bupropion]  ? ?Assessment / Plan:     ?Visit Diagnoses: No diagnosis found. ? ?Orders: ?No orders of the defined types were placed in this encounter. ? ?No orders of the defined types were placed in this encounter. ? ? ?Face-to-face time spent with patient was *** minutes. Greater than 50% of time was spent in counseling and coordination of care. ? ?Follow-Up Instructions: No follow-ups on file. ? ? ?Bo Merino, MD ? ?Note - This record has been created using [employer].  ?Chart creation errors have been sought, but may not always  ?have been located. Such creation errors do not reflect on  ?the standard of medical care. ?

## 2021-07-27 ENCOUNTER — Emergency Department (HOSPITAL_COMMUNITY): Payer: Self-pay

## 2021-07-27 ENCOUNTER — Other Ambulatory Visit: Payer: Self-pay

## 2021-07-27 ENCOUNTER — Emergency Department (HOSPITAL_COMMUNITY)
Admission: EM | Admit: 2021-07-27 | Discharge: 2021-07-28 | Disposition: A | Discharge status: Home / Self Care | Payer: Self-pay | Attending: Emergency Medicine | Admitting: Emergency Medicine

## 2021-07-27 ENCOUNTER — Encounter (HOSPITAL_COMMUNITY): Payer: Self-pay | Admitting: Emergency Medicine

## 2021-07-27 DIAGNOSIS — Z91040 Latex allergy status: Secondary | ICD-10-CM | POA: Insufficient documentation

## 2021-07-27 DIAGNOSIS — R112 Nausea with vomiting, unspecified: Secondary | ICD-10-CM | POA: Insufficient documentation

## 2021-07-27 DIAGNOSIS — R6883 Chills (without fever): Secondary | ICD-10-CM | POA: Insufficient documentation

## 2021-07-27 DIAGNOSIS — D72829 Elevated white blood cell count, unspecified: Secondary | ICD-10-CM | POA: Insufficient documentation

## 2021-07-27 DIAGNOSIS — R1032 Left lower quadrant pain: Secondary | ICD-10-CM | POA: Insufficient documentation

## 2021-07-27 DIAGNOSIS — R197 Diarrhea, unspecified: Secondary | ICD-10-CM | POA: Insufficient documentation

## 2021-07-27 LAB — CBC
HCT: 44.6 % (ref 36.0–46.0)
Hemoglobin: 15 g/dL (ref 12.0–15.0)
MCH: 30.7 pg (ref 26.0–34.0)
MCHC: 33.6 g/dL (ref 30.0–36.0)
MCV: 91.2 fL (ref 80.0–100.0)
Platelets: 374 10*3/uL (ref 150–400)
RBC: 4.89 MIL/uL (ref 3.87–5.11)
RDW: 12.6 % (ref 11.5–15.5)
WBC: 17.3 10*3/uL — ABNORMAL HIGH (ref 4.0–10.5)
nRBC: 0 % (ref 0.0–0.2)

## 2021-07-27 LAB — I-STAT BETA HCG BLOOD, ED (MC, WL, AP ONLY): I-stat hCG, quantitative: <5 m[IU]/mL (ref <5)

## 2021-07-27 LAB — URINALYSIS, ROUTINE W REFLEX MICROSCOPIC
Bilirubin Urine: NEGATIVE
Glucose, UA: NEGATIVE mg/dL
Hgb urine dipstick: NEGATIVE
Ketones, ur: 20 mg/dL — AB
Nitrite: NEGATIVE
Protein, ur: NEGATIVE mg/dL
Specific Gravity, Urine: 1.01 (ref 1.005–1.030)
pH: 8 (ref 5.0–8.0)

## 2021-07-27 LAB — COMPREHENSIVE METABOLIC PANEL
ALT: 28 U/L (ref 0–44)
AST: 27 U/L (ref 15–41)
Albumin: 4.2 g/dL (ref 3.5–5.0)
Alkaline Phosphatase: 84 U/L (ref 38–126)
Anion gap: 11 (ref 5–15)
BUN: 8 mg/dL (ref 6–20)
CO2: 21 mmol/L — ABNORMAL LOW (ref 22–32)
Calcium: 9.1 mg/dL (ref 8.9–10.3)
Chloride: 103 mmol/L (ref 98–111)
Creatinine, Ser: 0.73 mg/dL (ref 0.44–1.00)
GFR, Estimated: >60 mL/min (ref >60)
Glucose, Bld: 102 mg/dL — ABNORMAL HIGH (ref 70–99)
Potassium: 3.5 mmol/L (ref 3.5–5.1)
Sodium: 135 mmol/L (ref 135–145)
Total Bilirubin: 0.8 mg/dL (ref 0.3–1.2)
Total Protein: 7.7 g/dL (ref 6.5–8.1)

## 2021-07-27 LAB — LIPASE, BLOOD: Lipase: 25 U/L (ref 11–51)

## 2021-07-27 IMAGING — CT CT ABD-PELV W/ CM
2 of 4 series · 17 of 46 positions shown, 19 images · IV contrast (APPLIED)
Comparison: [DATE]

CLINICAL DATA: Severe left lower quadrant pain. Fever, chills,
nausea, vomiting.

EXAM:
CT ABDOMEN AND PELVIS WITH CONTRAST
TECHNIQUE: Multidetector CT imaging of the abdomen and pelvis was performed
using the standard protocol following bolus administration of
intravenous contrast.

[Series 3: abd/ pelvis 5.0 i30f 2 · axial · 0.91mm/px · z∈[+649,+1059]mm · 14 of 90 slices shown, 16 images]
[im 4/90  soft-tissue]
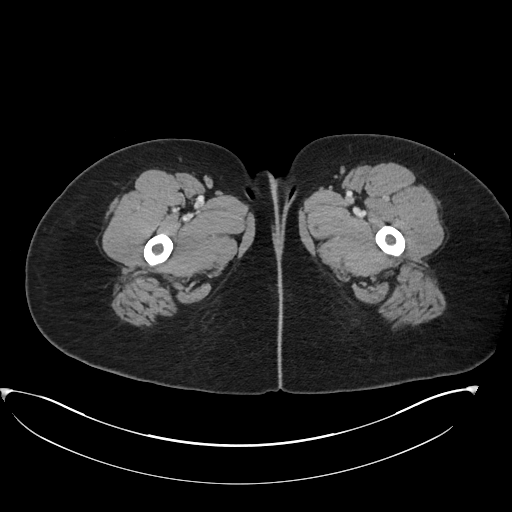
[im 4/90  bone]
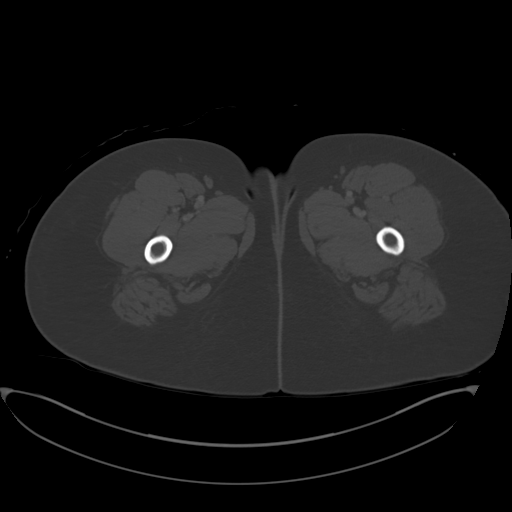
[im 12/90  soft-tissue]
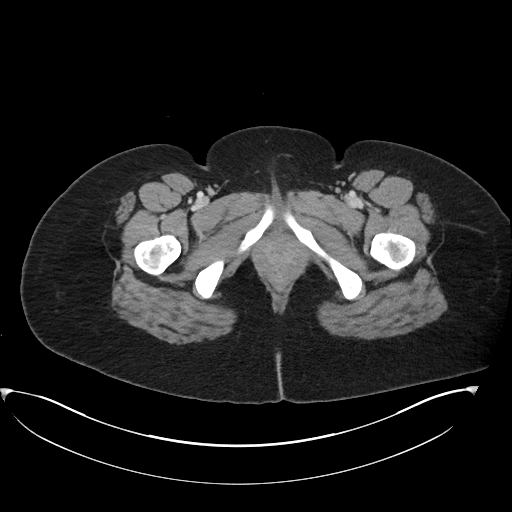
[im 19/90  soft-tissue]
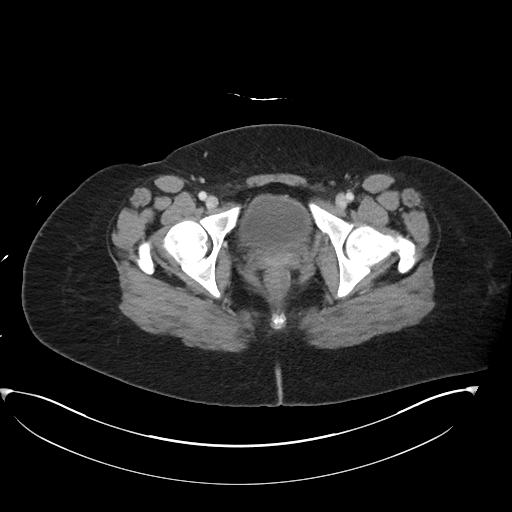
[im 23/90  soft-tissue]
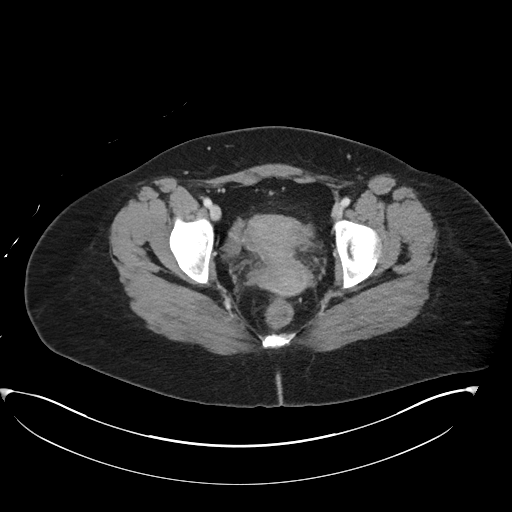
[im 30/90  soft-tissue]
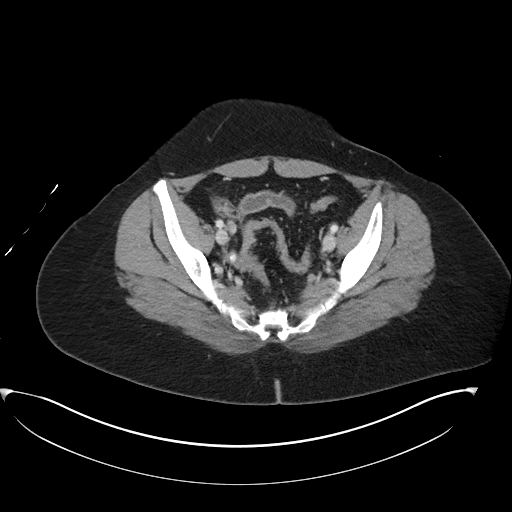
[im 38/90  soft-tissue]
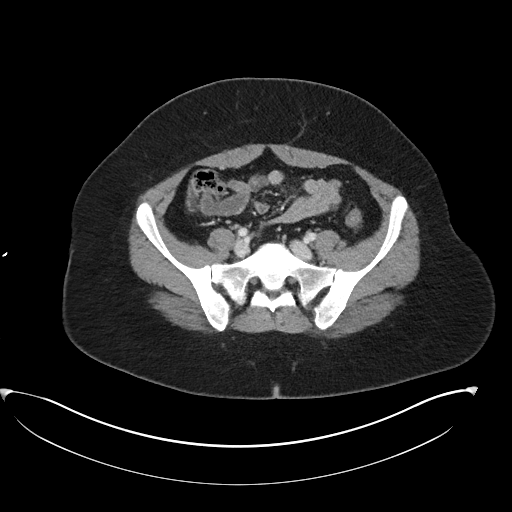
[im 41/90  soft-tissue]
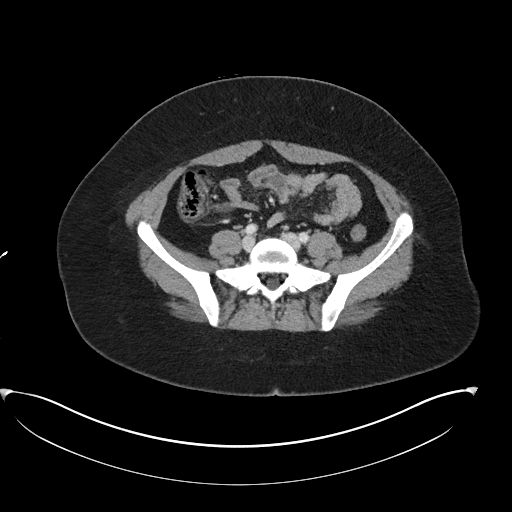
[im 49/90  soft-tissue]
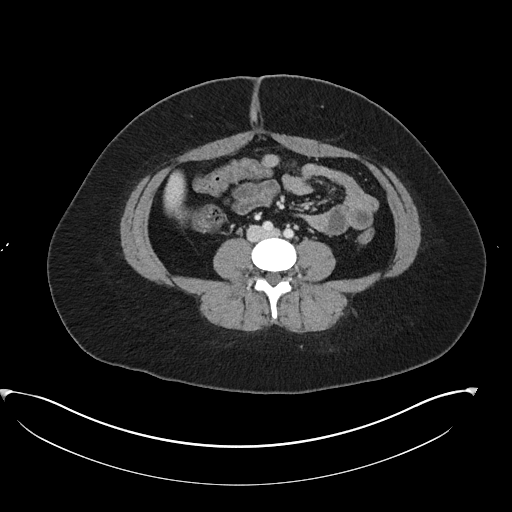
[im 52/90  soft-tissue]
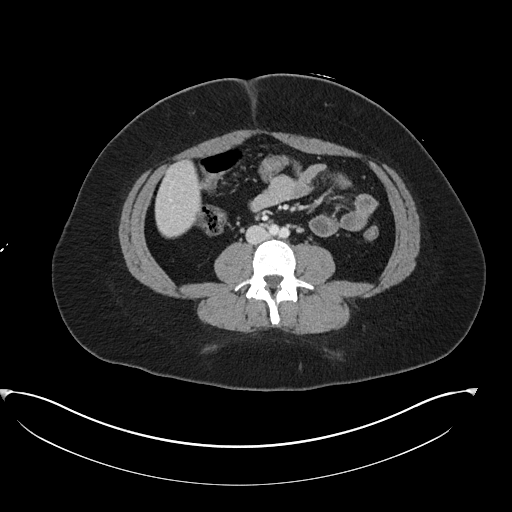
[im 52/90  bone]
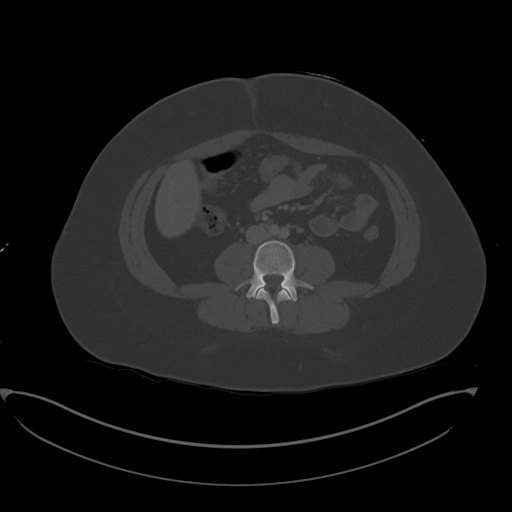
[im 60/90  soft-tissue]
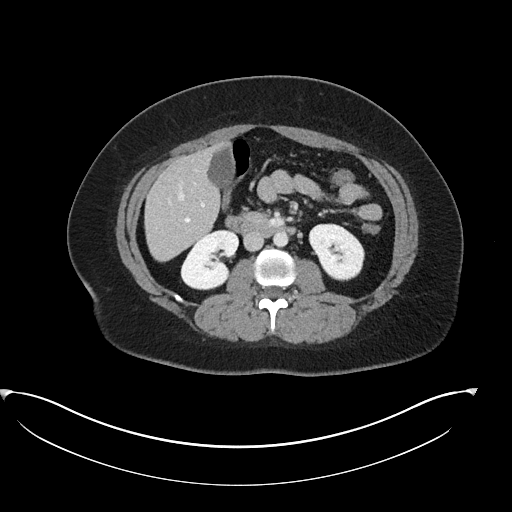
[im 67/90  soft-tissue]
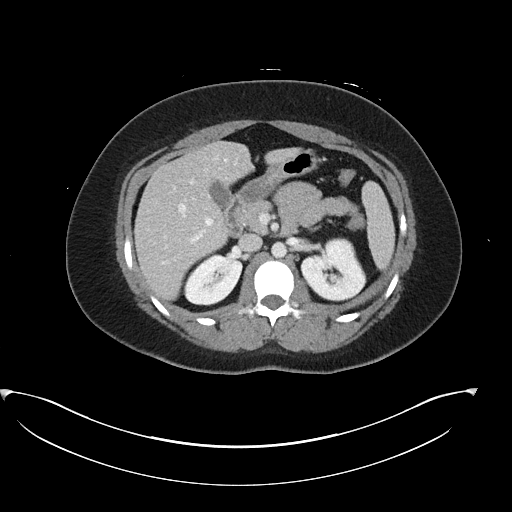
[im 71/90  soft-tissue]
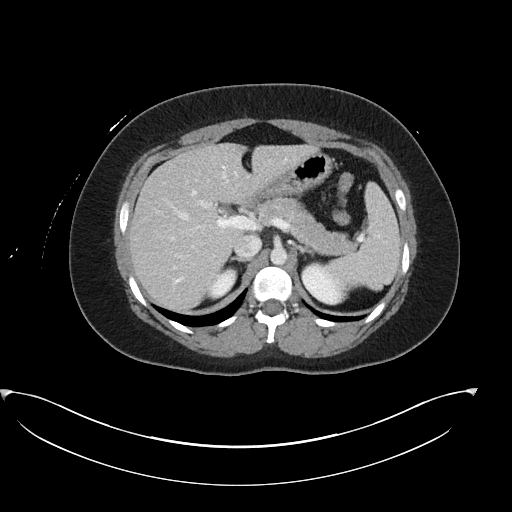
[im 78/90  soft-tissue]
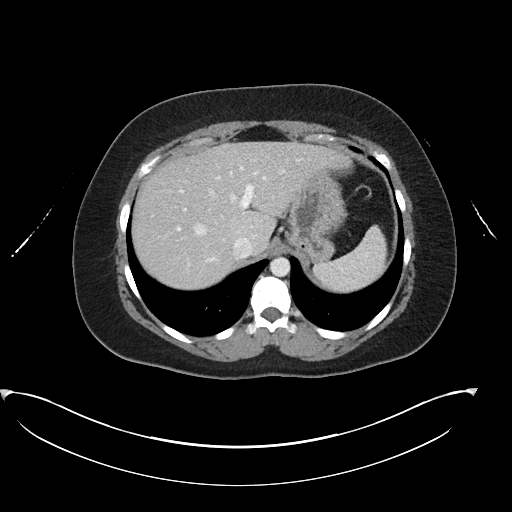
[im 86/90  soft-tissue]
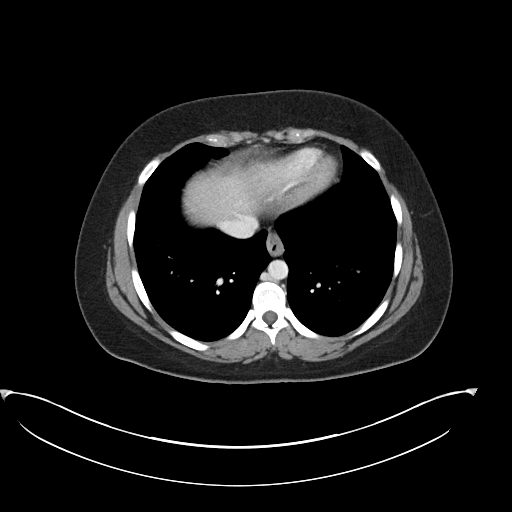

[Series 6: coronal soft tissue · coronal · 0.91mm/px · 3 of 110 slices shown]
[im 37/110  soft-tissue]
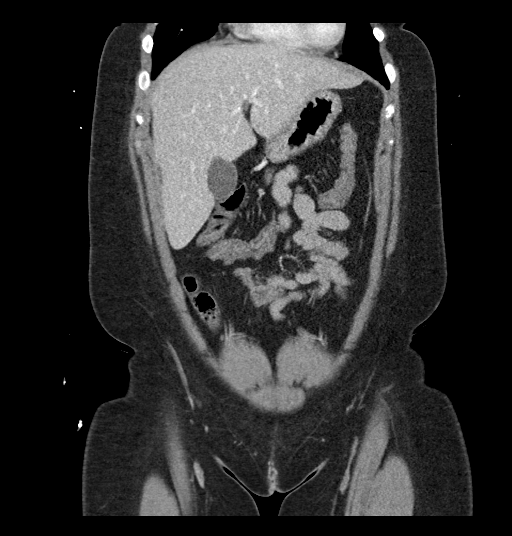
[im 49/110  soft-tissue]
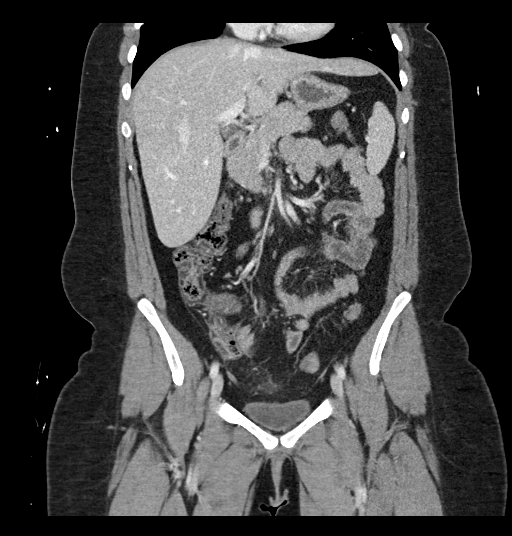
[im 61/110  soft-tissue]
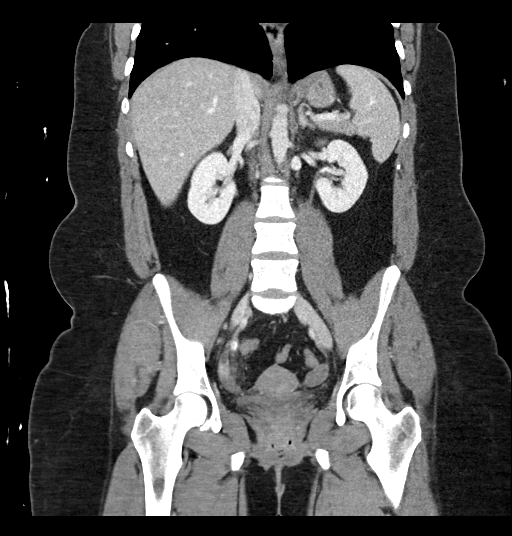

[17 of 46 positions shown; findings below may reference images not displayed]

RADIATION DOSE REDUCTION: This exam was performed according to the
departmental dose-optimization program which includes automated
exposure control, adjustment of the mA and/or kV according to
patient size and/or use of iterative reconstruction technique.

CONTRAST:  100mL OMNIPAQUE IOHEXOL 300 MG/ML  SOLN
FINDINGS: Lower chest: No acute abnormality

Hepatobiliary: No focal hepatic abnormality. Gallbladder
unremarkable.

Pancreas: No focal abnormality or ductal dilatation.

Spleen: No focal abnormality.  Normal size.

Adrenals/Urinary Tract: No adrenal abnormality. No focal renal
abnormality. No stones or hydronephrosis. Urinary bladder is
unremarkable.

Stomach/Bowel: Normal appendix. Stomach, large and small bowel
grossly unremarkable.

Vascular/Lymphatic: No evidence of aneurysm or adenopathy.

Reproductive: Uterus and adnexa unremarkable.  No mass.

Other: No free fluid or free air.

Musculoskeletal: No acute bony abnormality.
IMPRESSION: No acute findings in the abdomen or pelvis.

## 2021-07-27 MED ORDER — DROPERIDOL 2.5 MG/ML IJ SOLN
2.5000 mg | Freq: Once | INTRAMUSCULAR | Status: AC
  Administered 2021-07-27: 2.5 mg via INTRAVENOUS
  Filled 2021-07-27 (×2): qty 2

## 2021-07-27 MED ORDER — IOHEXOL 300 MG/ML  SOLN
100.0000 mL | Freq: Once | INTRAMUSCULAR | Status: AC | PRN
  Administered 2021-07-27: 100 mL via INTRAVENOUS

## 2021-07-27 MED ORDER — SODIUM CHLORIDE 0.9 % IV BOLUS
1000.0000 mL | Freq: Once | INTRAVENOUS | Status: AC
  Administered 2021-07-27: 1000 mL via INTRAVENOUS

## 2021-07-27 MED ORDER — ACETAMINOPHEN 325 MG PO TABS
650.0000 mg | ORAL_TABLET | Freq: Once | ORAL | Status: DC
  Filled 2021-07-27: qty 2

## 2021-07-27 MED ORDER — ONDANSETRON 4 MG PO TBDP
4.0000 mg | ORAL_TABLET | Freq: Once | ORAL | Status: AC | PRN
Start: 2021-07-27 — End: 2021-07-27
  Administered 2021-07-27: 4 mg via ORAL
  Filled 2021-07-27: qty 1

## 2021-07-27 MED ORDER — FAMOTIDINE IN NACL 20-0.9 MG/50ML-% IV SOLN
20.0000 mg | Freq: Once | INTRAVENOUS | Status: AC
  Administered 2021-07-27: 20 mg via INTRAVENOUS
  Filled 2021-07-27: qty 50

## 2021-07-27 NOTE — Progress Notes (Deleted)
? ?NEUROLOGY FOLLOW UP OFFICE NOTE ? ?Dicie Edelen ?[id number] ? ?Assessment/Plan:  ? ?Right ocular pain and vision loss - semiology consistent with optic neuritis, however complete neurological workup is negative based on eye exam, MRI brain/orbits/spinal cord with contrast as well as CSF analysis and serum labs.  ANA was initially borderline positive but repeat ANA was negative with ENA panel revealing only elevated RNP antibody, which is of unknown clinical significance but could represent underlying systemic autoimmune disease (such as mixed connective tissue disease).  Ultimately, as there are no objective test results to corroborate her symptoms, I am unsure of her diagnosis.  Query underlying systemic autoimmune disease.  She had a positive RNP antibody of unknown clinical significance. ?  ?She needs to be evaluated by neuro-ophthalmology.  Her ophthalmologist has referred her and she has an appointment later this month.  I asked that she have the neuro-ophthalmologist fax his findings to my office.  Further recommendations pending results.  Follow up after testing. ?  ?Subjective:  ?Nikeia Henkes is a 26 year old right-handed female with migraines and Bipolar disorder who follows up for visual disturbance.  MRI of cervical spine personally reviewed. ?  ?UPDATE: ?She saw neuro-ophthalmology, *** ? ?She saw rheumatology last month.  She has been referred to Dr. Sherryll Burger *** for evaluation of autoimmune etiology.   ?  ?She continues to have vision deficits in the right eye.  She particularly can't see in the dark.  .  No headaches.   ?  ?HISTORY: ?Around 03/06/2021, she became sick with nausea and vomiting.  Afterwards, she developed blurred vision involving the nasal aspect of her right eye.  She also has started seeing flashes as well, also more prominent in the dark.  Over the next couple of days, it progressed to involve the entire right visual field as well as color desaturation.  She also reported right ocular  pain and right sided headache.  She went to the ED on 03/09/2021 for further evaluation.  MRI of brain and orbits with and without contrast personally reviewed were unremarkable for evidence of MS or optic neuritis.  MRI of thoracic spine was unremarkable.  She saw ophthalmology on 03/17/2021 demonstrated OD nasal depression respecting the vertical midline on HVF but no evidence of optic nerve or disc edema.  She is on a small dose of methylprednislone for skin condition.   ? ?She underwent further neurologic workup which was unremarkable: ?04/05/2021 SERUM:  NMO antibody negative, mildly elevated sed rate 45, negative RF, ANA borderline positive with 1:40 titer nuclear, speckled,  Follow up ANA with ENA panel on 04/11/2021 demonstrated negative ANA and ENA with positive RNP ab 2.1 but otherwise negative ?04/10/2021 MRI C-SPINE W/WO:  Normal ?04/14/2021 CSF ANALYSIS:  cell count negative, protein 21, glucose 59, 0 oligoclonal bands, IgG index 2, negative, gram stain and culture, negative cytology, and negative ACE ?  ?FH: migraines but not MS ? ?PAST MEDICAL HISTORY: ?Past Medical History:  ?Diagnosis Date  ? Anxiety   ? Fracture of upper end of left tibia 07/06/2014  ? fell while skateboarding  ? Insomnia   ? Mental disorder   ? bipolar  ? Migraines   ? ? ?MEDICATIONS: ?Current Outpatient Medications on File Prior to Visit  ?Medication Sig Dispense Refill  ? cyclobenzaprine (FLEXERIL) 10 MG tablet Take 1 tablet (10 mg total) by mouth 3 (three) times daily as needed for muscle spasms (caution for drowsiness). 90 tablet 3  ? dicyclomine (BENTYL) 20 MG tablet  Take 20 mg by mouth 3 (three) times daily as needed for spasms.     ? ketorolac (ACULAR) 0.5 % ophthalmic solution Place 1 drop into the right eye 4 (four) times daily. (Patient not taking: Reported on 07/07/2021)    ? ondansetron (ZOFRAN-ODT) 8 MG disintegrating tablet Take by mouth as needed.    ? pantoprazole (PROTONIX) 40 MG tablet Take 40 mg by mouth daily.     ? risperiDONE (RISPERDAL) 0.25 MG tablet Take 0.25 mg by mouth at bedtime.    ? [DISCONTINUED] norgestimate-ethinyl estradiol (ORTHO-CYCLEN,SPRINTEC,PREVIFEM) 0.25-35 MG-MCG tablet Take 1 tablet by mouth daily. 1 Package 11  ? [DISCONTINUED] omeprazole (PRILOSEC) 20 MG capsule Take 1 capsule (20 mg total) by mouth daily. 30 capsule 0  ? [DISCONTINUED] promethazine (PHENERGAN) 12.5 MG tablet Take 12.5 mg by mouth every 6 (six) hours as needed for nausea or vomiting.     ? [DISCONTINUED] verapamil (VERELAN PM) 120 MG 24 hr capsule Take 120 mg by mouth at bedtime.    ? ?No current facility-administered medications on file prior to visit.  ? ? ?ALLERGIES: ?Allergies  ?Allergen Reactions  ? Ibuprofen Nausea And Vomiting  ?  Other reaction(s): Abdominal Pain  ? Tamiflu [Oseltamivir Phosphate] Nausea And Vomiting and Rash  ? Dexamethasone Hives  ? Flagyl [Metronidazole] Rash  ? Latex Other (See Comments)  ?  Makes skin raw  ? Nickel Rash  ? Oxycodone Nausea And Vomiting  ? Wellbutrin [Bupropion] Nausea Only and Other (See Comments)  ?  Patient had insomnia and nausea and she could not  ? ? ?FAMILY HISTORY: ?Family History  ?Problem Relation Age of Onset  ? Endometriosis Mother   ? Pancreatic disease Father   ? Healthy Sister   ? Migraines Paternal Grandmother   ? Dementia Paternal Grandmother   ? Stroke Paternal Grandfather   ? ? ?  ?Objective:  ?*** ?General: No acute distress.  Patient appears ***-groomed.   ?Head:  Normocephalic/atraumatic ?Eyes:  Fundi examined but not visualized ?Neck: supple, no paraspinal tenderness, full range of motion ?Heart:  Regular rate and rhythm ?Lungs:  Clear to auscultation bilaterally ?Back: No paraspinal tenderness ?Neurological Exam: alert and oriented to person, place, and time.  Speech fluent and not dysarthric, language intact.  CN II-XII intact. Bulk and tone normal, muscle strength 5/5 throughout.  Sensation to light touch intact.  Deep tendon reflexes 2+ throughout, toes  downgoing.  Finger to nose testing intact.  Gait normal, Romberg negative. ? ? ?Shon Millet, DO ? ?CC: *** ? ? ? ? ? ? ?

## 2021-07-27 NOTE — ED Triage Notes (Signed)
Pt reported to ED with c/o severe LLQ abdominal pain, onset began at approximately 0100 this AM. Pt states it was shortly accompanied by fever, chills, nausea and vomiting. Stools have gradually transformed from solid to soft/liquid-like. Has hx of IBS. States she took tylenol and bentyl with no relief in symptoms.  ?

## 2021-07-27 NOTE — ED Provider Triage Note (Signed)
Emergency Medicine Provider Triage Evaluation Note ? ?[name] , a 26 y.o. female  was evaluated in triage.  Pt complains of diffuse severe abdominal pain, started this morning.  Worst in the LLQ and umbilicus.  No N/V.  Endorses some diarrhea.  Denies fever.  Tearful during exam. ? ?Review of Systems  ?Positive: Mental pain ?Negative: Fever ? ?Physical Exam  ?BP 113/65   Pulse 72   Temp 98.7 ?F (37.1 ?C) (Oral)   Resp 16   SpO2 99%  ?Gen:   Awake, no distress   ?Resp:  Normal effort  ?MSK:   Moves extremities without difficulty  ?Other:  Extreme tenderness to palpation globally, with increased intensity of LLQ and umbilicus ? ?Medical Decision Making  ?Medically screening exam initiated at 8:41 PM.  Appropriate orders placed.  Merryl Buckels was informed that the remainder of the evaluation will be completed by another provider, this initial triage assessment does not replace that evaluation, and the importance of remaining in the ED until their evaluation is complete. ? ?Labs and imaging ordered ? ?Zofran and ibuprofen given ?  ?Cecil Cobbs, PA-C ?07/27/21 2048 ? ?

## 2021-07-28 ENCOUNTER — Ambulatory Visit: Payer: Self-pay | Admitting: Neurology

## 2021-07-28 MED ORDER — ACETAMINOPHEN 325 MG PO TABS
650.0000 mg | ORAL_TABLET | Freq: Once | ORAL | Status: AC
  Administered 2021-07-28: 650 mg via ORAL

## 2021-07-28 NOTE — ED Provider Notes (Signed)
[facility] EMERGENCY DEPARTMENT Provider Note   CSN: [id number] Arrival date & time: 07/27/21  1903     History  Chief Complaint  Patient presents with   Abdominal Pain    Vanessa Nelson is a 26 y.o. female.  Vanessa Nelson is a 26 y.o. female with a history of migraines, anxiety, insomnia, who presents to the emergency department for evaluation of nausea, vomiting and severe left lower quadrant abdominal pain.  Symptoms started around 1 AM yesterday morning.  Patient reports she has had numerous episodes of vomiting, and also reports that she started to have some nonbloody diarrhea.  She reports some chills, no measured fevers.  No associated cough, congestion or rhinorrhea.  Reports she started having some left lower quadrant abdominal pain but reports that the pain seems to move around to other areas of the abdomen in between episodes of vomiting.  She has had frequent episodes of nausea and vomiting over the past year and is followed by GI for this but reports this episode has been worse than usual and not improved with her home Zofran and Phenergan.  She does report marijuana use, denies other drug use.  The history is provided by the patient and medical records.      Home Medications Prior to Admission medications   Medication Sig Start Date End Date Taking? Authorizing Provider  dicyclomine (BENTYL) 20 MG tablet Take 20 mg by mouth 3 (three) times daily as needed for spasms.  07/30/17 07/27/21 Yes [provider]  ibuprofen (ADVIL) 200 MG tablet Take 200 mg by mouth every 6 (six) hours as needed for mild pain.   Yes [provider]  pantoprazole (PROTONIX) 40 MG tablet Take 40 mg by mouth daily.   Yes [provider]  cyclobenzaprine (FLEXERIL) 10 MG tablet Take 1 tablet (10 mg total) by mouth 3 (three) times daily as needed for muscle spasms (caution for drowsiness). Patient not taking: Reported on 07/27/2021 04/25/21   Drema Dallas, DO   norgestimate-ethinyl estradiol (ORTHO-CYCLEN,SPRINTEC,PREVIFEM) 0.25-35 MG-MCG tablet Take 1 tablet by mouth daily. 07/01/17 06/22/19  Willodean Rosenthal, MD  omeprazole (PRILOSEC) 20 MG capsule Take 1 capsule (20 mg total) by mouth daily. 07/25/18 06/22/19  Azalia Bilis, MD  promethazine (PHENERGAN) 12.5 MG tablet Take 12.5 mg by mouth every 6 (six) hours as needed for nausea or vomiting.  02/04/18 06/22/19  [provider]  verapamil (VERELAN PM) 120 MG 24 hr capsule Take 120 mg by mouth at bedtime. 04/11/18 06/22/19  [provider]      Allergies    Ibuprofen, Tamiflu [oseltamivir phosphate], Dexamethasone, Flagyl [metronidazole], Latex, Nickel, Oxycodone, and Wellbutrin [bupropion]    Review of Systems   Review of Systems  Constitutional:  Negative for chills and fever.  Respiratory:  Negative for cough and shortness of breath.   Cardiovascular:  Negative for chest pain.  Gastrointestinal:  Positive for abdominal pain, diarrhea, nausea and vomiting.  Genitourinary:  Negative for dysuria, hematuria, vaginal bleeding and vaginal discharge.  Musculoskeletal:  Negative for arthralgias and myalgias.  Neurological:  Negative for dizziness, syncope and light-headedness.   Physical Exam Updated Vital Signs BP 113/65    Pulse 72    Temp 98.7 F (37.1 C) (Oral)    Resp 16    SpO2 99%  Physical Exam Vitals and nursing note reviewed.  Constitutional:      General: She is not in acute distress.    Appearance: Normal appearance. She is well-developed. She is  not diaphoretic.  HENT:     Head: Normocephalic and atraumatic.  Eyes:     General:        Right eye: No discharge.        Left eye: No discharge.     Pupils: Pupils are equal, round, and reactive to light.  Cardiovascular:     Rate and Rhythm: Normal rate and regular rhythm.     Pulses: Normal pulses.     Heart sounds: Normal heart sounds.  Pulmonary:     Effort: Pulmonary effort is normal. No respiratory  distress.     Breath sounds: Normal breath sounds. No wheezing or rales.     Comments: Respirations equal and unlabored, patient able to speak in full sentences, lungs clear to auscultation bilaterally  Abdominal:     General: Bowel sounds are normal. There is no distension.     Palpations: Abdomen is soft. There is no mass.     Tenderness: There is generalized abdominal tenderness and tenderness in the left lower quadrant. There is no guarding.     Comments: Abdomen is soft, nondistended, bowel sounds present throughout, there is some mild generalized tenderness throughout the abdomen, pain most notably at the left lower quadrant but without guarding or peritoneal signs.  Musculoskeletal:        General: No deformity.     Cervical back: Neck supple.  Skin:    General: Skin is warm and dry.     Capillary Refill: Capillary refill takes less than 2 seconds.  Neurological:     Mental Status: She is alert and oriented to person, place, and time.     Coordination: Coordination normal.     Comments: Speech is clear, able to follow commands Moves extremities without ataxia, coordination intact  Psychiatric:        Mood and Affect: Mood normal.        Behavior: Behavior normal.    ED Results / Procedures / Treatments   Labs (all labs ordered are listed, but only abnormal results are displayed) Labs Reviewed  COMPREHENSIVE METABOLIC PANEL - Abnormal; Notable for the following components:      Result Value   CO2 21 (*)    Glucose, Bld 102 (*)    All other components within normal limits  CBC - Abnormal; Notable for the following components:   WBC 17.3 (*)    All other components within normal limits  URINALYSIS, ROUTINE W REFLEX MICROSCOPIC - Abnormal; Notable for the following components:   APPearance HAZY (*)    Ketones, ur 20 (*)    Leukocytes,Ua LARGE (*)    Bacteria, UA RARE (*)    All other components within normal limits  LIPASE, BLOOD  I-STAT BETA HCG BLOOD, ED ([facility], [facility], [facility]  ONLY)    EKG None  Radiology CT Abdomen Pelvis W Contrast  Result Date: 07/27/2021 CLINICAL DATA:  Severe left lower quadrant pain. Fever, chills, nausea, vomiting. EXAM: CT ABDOMEN AND PELVIS WITH CONTRAST TECHNIQUE: Multidetector CT imaging of the abdomen and pelvis was performed using the standard protocol following bolus administration of intravenous contrast. RADIATION DOSE REDUCTION: This exam was performed according to the departmental dose-optimization program which includes automated exposure control, adjustment of the mA and/or kV according to patient size and/or use of iterative reconstruction technique. CONTRAST:  OMNIPAQUE IOHEXOL 300 MG/ML  SOLN COMPARISON:  03/09/2017 FINDINGS: Lower chest: No acute abnormality Hepatobiliary: No focal hepatic abnormality. Gallbladder unremarkable. Pancreas: No focal abnormality or ductal dilatation. Spleen: No  focal abnormality.  Normal size. Adrenals/Urinary Tract: No adrenal abnormality. No focal renal abnormality. No stones or hydronephrosis. Urinary bladder is unremarkable. Stomach/Bowel: Normal appendix. Stomach, large and small bowel grossly unremarkable. Vascular/Lymphatic: No evidence of aneurysm or adenopathy. Reproductive: Uterus and adnexa unremarkable.  No mass. Other: No free fluid or free air. Musculoskeletal: No acute bony abnormality. IMPRESSION: No acute findings in the abdomen or pelvis. Electronically Signed   By: Charlett NoseKevin  Dover M.D.   On: 07/27/2021 22:06    Procedures Procedures    Medications Ordered in ED Medications  ondansetron (ZOFRAN-ODT) disintegrating tablet 4 mg (4 mg Oral Given 07/27/21 2014)  iohexol (OMNIPAQUE) 300 MG/ML solution 100 mL (100 mLs Intravenous Contrast Given 07/27/21 2158)  droperidol (INAPSINE) 2.5 MG/ML injection 2.5 mg (2.5 mg Intravenous Given 07/27/21 2354)  famotidine (PEPCID) IVPB 20 mg premix (20 mg Intravenous New Bag/Given 07/27/21 2357)  sodium chloride 0.9 % bolus 1,000 mL (1,000 mLs  Intravenous New Bag/Given 07/27/21 2354)  acetaminophen (TYLENOL) tablet 650 mg (650 mg Oral Given 07/28/21 0055)    ED Course/ Medical Decision Making/ A&P                           This patient presents to the ED for concern of abdominal pain, nausea, vomiting, this involves an extensive number of treatment options, and is a complaint that carries with it a high risk of complications and morbidity.  The differential diagnosis includes gastritis, gastroenteritis, PUD, cyclical vomiting syndrome, pancreatitis, cholecystitis, appendicitis, diverticulitis, bowel obstruction, perforation, ovarian torsion, TOA, ovarian cyst, PID.   Co morbidities that complicate the patient evaluation  Migraines, anxiety, marijuana use   Additional history obtained:  Additional history obtained from significant other at bedside External records from outside source obtained and reviewed including recent outpatient follow-up with neurology, rheumatology and GI   Lab Tests:  I Ordered, and personally interpreted labs.  The pertinent results include: Leukocytosis of 17.3, likely in the setting of recurrent vomiting, normal hemoglobin, no significant electrolyte derangements, normal renal and liver function normal lipase, negative pregnancy, urinalysis with large leukocytes present but rare bacteria and many squamous cells present, patient not having dysuria, high clinical suspicion for contamination.  Patient without discharge or focal pelvic pain   Imaging Studies ordered:  I ordered imaging studies including CT abdomen pelvis I independently visualized and interpreted imaging which showed no acute findings, no evidence of obstruction or diverticulitis on my review I agree with the radiologist interpretation   EKG:  EKG with normal sinus rhythm with normal QT interval  Medicines ordered and prescription drug management:  Patient received Zofran and ibuprofen initially in triage with minimal improvement I  am very concerned for cyclical vomiting syndrome, patient does report marijuana use, patient treated with droperidol IV fluid bolus, Pepcid and Tylenol reports resolution of her symptoms Reevaluation of the patient after these medicines showed that the patient resolved I have reviewed the patients home medicines and have made adjustments as needed   Critical Interventions:  IV fluids and antiemetics   Problem List / ED Course:  Patient with abdominal pain associated with severe nausea and vomiting, pain seems to be worsened with episodes of vomiting.  She has had ongoing issues with nausea and vomiting over the past year.  Given reassuring work-up I have increased suspicion for cyclic vomiting syndrome Patient treated with IV fluid bolus and droperidol with resolution of vomiting and subsequently significant improvement in pain Given reassuring work-up do  not feel patient needs additional evaluation at this time, recommend avoiding marijuana and close follow-up with her primary care provider, she has antiemetics at home.    Disposition:  Given the patient's lab work and imaging is reassuring and vomiting has subsided with additional medication do not feel that she will require admission, feel patient is appropriate for discharge home with close outpatient follow-up and continued symptomatic treatment.  Also counseled her on avoiding marijuana.           Final Clinical Impression(s) / ED Diagnoses Final diagnoses:  Nausea and vomiting, unspecified vomiting type  Left lower quadrant abdominal pain    Rx / DC Orders ED Discharge Orders     None         Legrand Rams 08/01/21 1435    Nira Conn, MD 08/03/21 [phone]

## 2021-07-28 NOTE — Discharge Instructions (Addendum)
Continue using your home nausea medication as needed for any additional nausea or vomiting.  Try and have small frequent meals of clear liquids and bland foods over the next few days.  Your lab work and CT scan today were overall reassuring.  Please avoid using marijuana as that can cause a cyclical vomiting syndrome.  Follow-up closely with your GI doctor for continued evaluation. ? ?Return for new or worsening symptoms. ?

## 2021-07-31 ENCOUNTER — Ambulatory Visit: Payer: No Typology Code available for payment source | Admitting: Rheumatology

## 2021-08-03 ENCOUNTER — Telehealth: Payer: Self-pay | Admitting: Neurology

## 2021-08-03 ENCOUNTER — Telehealth: Payer: Self-pay | Admitting: Rheumatology

## 2021-08-03 ENCOUNTER — Ambulatory Visit: Payer: No Typology Code available for payment source | Admitting: Rheumatology

## 2021-08-03 ENCOUNTER — Encounter: Payer: Self-pay | Admitting: Rheumatology

## 2021-08-03 DIAGNOSIS — Z840 Family history of diseases of the skin and subcutaneous tissue: Secondary | ICD-10-CM

## 2021-08-03 DIAGNOSIS — R21 Rash and other nonspecific skin eruption: Secondary | ICD-10-CM

## 2021-08-03 DIAGNOSIS — H539 Unspecified visual disturbance: Secondary | ICD-10-CM

## 2021-08-03 DIAGNOSIS — G8929 Other chronic pain: Secondary | ICD-10-CM

## 2021-08-03 DIAGNOSIS — F3131 Bipolar disorder, current episode depressed, mild: Secondary | ICD-10-CM

## 2021-08-03 DIAGNOSIS — F5101 Primary insomnia: Secondary | ICD-10-CM

## 2021-08-03 DIAGNOSIS — Z8379 Family history of other diseases of the digestive system: Secondary | ICD-10-CM

## 2021-08-03 DIAGNOSIS — L659 Nonscarring hair loss, unspecified: Secondary | ICD-10-CM

## 2021-08-03 DIAGNOSIS — K582 Mixed irritable bowel syndrome: Secondary | ICD-10-CM

## 2021-08-03 DIAGNOSIS — M797 Fibromyalgia: Secondary | ICD-10-CM

## 2021-08-03 DIAGNOSIS — R5383 Other fatigue: Secondary | ICD-10-CM

## 2021-08-03 DIAGNOSIS — M79641 Pain in right hand: Secondary | ICD-10-CM

## 2021-08-03 DIAGNOSIS — R768 Other specified abnormal immunological findings in serum: Secondary | ICD-10-CM

## 2021-08-03 DIAGNOSIS — Z8719 Personal history of other diseases of the digestive system: Secondary | ICD-10-CM

## 2021-08-03 NOTE — Telephone Encounter (Signed)
Patient called to resch her appt with jaffe. Stated she has not received her results from rheumatology yet, does jaffe want her to wait til she recvs them or go ahead and make an appt? ?

## 2021-08-03 NOTE — Telephone Encounter (Signed)
Please ask patient if she wants to see Ladona Ridgel prior to the April 26 appointment.

## 2021-08-03 NOTE — Telephone Encounter (Signed)
Patient called the office stating she woke up with a fever today and would like her visit to be virtual if possible. Patients appointment is a new patient follow up. Please advise. Next available appointment is April  ?

## 2021-08-03 NOTE — Telephone Encounter (Signed)
It is okay to wait until April for the follow-up visit.

## 2021-08-04 NOTE — Telephone Encounter (Signed)
Per pt the [facility] opthalmology hasn't sent over her notes to anyone. She has requested as well as the her other providers have.  ?She was advised that he did not see anything and no follow or anything hasn't been scheduled or discussed.  ? ?Her follow up with Dr. Cherlyn Nelson not til April no discussion on results until then as well.  ?

## 2021-08-04 NOTE — Telephone Encounter (Signed)
I received the neuro-ophthalmology notes from Dr. Frederico Hamman at [facility].  As Oma stated, exam was unremarkable.  I do agree with keeping upcoming appointment with the neuro-ophthalmologist later this month.  ? ? ?Pt advised. Please have those sent over so Dr.Jaffe can review them. Follow next steps to be advised then.  ?

## 2021-09-05 ENCOUNTER — Ambulatory Visit: Payer: No Typology Code available for payment source | Admitting: Neurology

## 2021-09-07 NOTE — Progress Notes (Deleted)
Office Visit Note  Patient: Vanessa Nelson             Date of Birth: 08/22/1995           MRN: [id number]             PCP: Alma Friendly, MD Referring: Alma Friendly, MD Visit Date: 09/20/2021 Occupation: '@GUAROCC' @  Subjective:  No chief complaint on file.   History of Present Illness: Vanessa Nelson is a 26 y.o. female ***   Activities of Daily Living:  Patient reports morning stiffness for *** {minute/hour:19697}.   Patient {ACTIONS;DENIES/REPORTS:21021675::"Denies"} nocturnal pain.  Difficulty dressing/grooming: {ACTIONS;DENIES/REPORTS:21021675::"Denies"} Difficulty climbing stairs: {ACTIONS;DENIES/REPORTS:21021675::"Denies"} Difficulty getting out of chair: {ACTIONS;DENIES/REPORTS:21021675::"Denies"} Difficulty using hands for taps, buttons, cutlery, and/or writing: {ACTIONS;DENIES/REPORTS:21021675::"Denies"}  No Rheumatology ROS completed.   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    Past Medical History:  Diagnosis Date   Anxiety    Fracture of upper end of left tibia 07/06/2014   fell while skateboarding   Insomnia    Mental disorder    bipolar   Migraines     Family History  Problem Relation Age of Onset   Endometriosis Mother    Pancreatic disease Father    Healthy Sister    Migraines Paternal Grandmother    Dementia Paternal Grandmother    Stroke Paternal Grandfather    Past Surgical History:  Procedure Laterality Date   CHONDROPLASTY Left 07/09/2014   Procedure: CHONDROPLASTY;  Surgeon: Renette Butters, MD;  Location: [facility];  Service: Orthopedics;  Laterality: Left;   DENTAL SURGERY     for impacted teeth   KNEE ARTHROSCOPY Left 07/09/2014   Procedure: ARTHROSCOPY KNEE;  Surgeon: Renette Butters, MD;   Location: [facility];  Service: Orthopedics;  Laterality: Left;   ORIF TIBIA PLATEAU Left 07/09/2014   Procedure: OPEN REDUCTION INTERNAL FIXATION (ORIF) TIBIAL PLATEAU;  Surgeon: Renette Butters, MD;  Location: [facility];  Service: Orthopedics;  Laterality: Left;   TONSILLECTOMY     age 16   UPPER GI ENDOSCOPY  09/10/2019   Social History   Social History Narrative   Not on file    There is no immunization history on file for this patient.   Objective: Vital Signs: There were no vitals taken for this visit.   Physical Exam   Musculoskeletal Exam: ***  CDAI Exam: CDAI Score: -- Patient Global: --; Provider Global: -- Swollen: --; Tender: -- Joint Exam 09/20/2021   No joint exam has been documented for this visit   There is currently no information documented on the homunculus. Go to the Rheumatology activity and complete the homunculus joint exam.  Investigation: No additional findings.  Imaging: No results found.  Recent Labs: Lab Results  Component Value Date   WBC 17.3 (H) 07/27/2021   HGB 15.0 07/27/2021   PLT 374 07/27/2021   NA 135 07/27/2021   K 3.5 07/27/2021   CL 103 07/27/2021   CO2 21 (L) 07/27/2021   GLUCOSE 102 (H) 07/27/2021   BUN 8 07/27/2021   CREATININE 0.73 07/27/2021   BILITOT 0.8 07/27/2021   ALKPHOS 84 07/27/2021   AST 27 07/27/2021   ALT 28 07/27/2021   PROT 7.7 07/27/2021   ALBUMIN 4.2 07/27/2021  CALCIUM 9.1 07/27/2021   GFRAA >60 07/25/2018   July 07, 2021 UA showed 2+ leukocytes WBCs, epithelial cells, many bacteria, CK 69, ESR 29, RNP 2.1, C3-C4 normal, anti-CCP negative, HLA-B27 negative   Speciality Comments: No specialty comments available.  Procedures:  No procedures performed Allergies: Ibuprofen, Tamiflu [oseltamivir phosphate], Dexamethasone, Flagyl [metronidazole], Latex, Nickel, Oxycodone, and Wellbutrin [bupropion]   Assessment / Plan:     Visit Diagnoses: No diagnosis  found.  Orders: No orders of the defined types were placed in this encounter.  No orders of the defined types were placed in this encounter.   Face-to-face time spent with patient was *** minutes. Greater than 50% of time was spent in counseling and coordination of care.  Follow-Up Instructions: No follow-ups on file.   Bo Merino, MD  Note - This record has been created using Editor, commissioning.  Chart creation errors have been sought, but may not always  have been located. Such creation errors do not reflect on  the standard of medical care.

## 2021-09-20 ENCOUNTER — Ambulatory Visit: Payer: Self-pay | Admitting: Rheumatology

## 2021-09-20 DIAGNOSIS — Z8379 Family history of other diseases of the digestive system: Secondary | ICD-10-CM

## 2021-09-20 DIAGNOSIS — R21 Rash and other nonspecific skin eruption: Secondary | ICD-10-CM

## 2021-09-20 DIAGNOSIS — M797 Fibromyalgia: Secondary | ICD-10-CM

## 2021-09-20 DIAGNOSIS — Z840 Family history of diseases of the skin and subcutaneous tissue: Secondary | ICD-10-CM

## 2021-09-20 DIAGNOSIS — Z8719 Personal history of other diseases of the digestive system: Secondary | ICD-10-CM

## 2021-09-20 DIAGNOSIS — R768 Other specified abnormal immunological findings in serum: Secondary | ICD-10-CM

## 2021-09-20 DIAGNOSIS — G8929 Other chronic pain: Secondary | ICD-10-CM

## 2021-09-20 DIAGNOSIS — F5101 Primary insomnia: Secondary | ICD-10-CM

## 2021-09-20 DIAGNOSIS — L659 Nonscarring hair loss, unspecified: Secondary | ICD-10-CM

## 2021-09-20 DIAGNOSIS — F3131 Bipolar disorder, current episode depressed, mild: Secondary | ICD-10-CM

## 2021-09-20 DIAGNOSIS — M79642 Pain in left hand: Secondary | ICD-10-CM

## 2021-09-20 DIAGNOSIS — H539 Unspecified visual disturbance: Secondary | ICD-10-CM

## 2021-09-20 DIAGNOSIS — R5383 Other fatigue: Secondary | ICD-10-CM

## 2021-09-20 DIAGNOSIS — K582 Mixed irritable bowel syndrome: Secondary | ICD-10-CM

## 2021-10-11 ENCOUNTER — Encounter: Payer: Self-pay | Admitting: Neurology

## 2021-12-27 ENCOUNTER — Telehealth: Payer: Self-pay | Admitting: Rheumatology

## 2021-12-27 ENCOUNTER — Telehealth: Payer: Self-pay | Admitting: Neurology

## 2021-12-27 DIAGNOSIS — R768 Other specified abnormal immunological findings in serum: Secondary | ICD-10-CM

## 2021-12-27 NOTE — Telephone Encounter (Signed)
We can schedule a televisit to discuss lab results.  Although based on her labs she will need an examination to evaluate her further.  She may choose to come for an office visit to discuss labs and have a complete exam or she may schedule an office visit after a televisit.

## 2021-12-27 NOTE — Telephone Encounter (Addendum)
Per patient she was advised that she had to do more lab work before [employer] will see her. So patient will not go back to Dr. Corliss Skains office. If we do not agree she will ask for the manger. She feels like we are at that point.   I di explain to the patient that some diagnoses can take a couple of test before we find out the route of the problem. An Dr.jaffe just advised on 09/2021 to see if she could stay with that office since she is already established. Due to it couple take a while to get into another office and restart the process.

## 2021-12-27 NOTE — Telephone Encounter (Signed)
Patient called requesting to schedule a telehealth appointment to discuss the results of her labwork in February.  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  Patient states she was told that Dr. Corliss Skains schedules telehealth appointments in "certain situations."  Patient states she also sent a Mychart message that was never answered.  Patient states she needs a telehealth visit to explain why she needs to come in the office for additional labwork and if  the office cannot accommodate her request she needs to be referred to another rheumatologist.

## 2021-12-27 NOTE — Telephone Encounter (Signed)
Pt called in stating Dr. Everlena Cooper had sent a referral to a Rheumatologist, but she has decided she does not want to go to that office. She would like to know if the referral could be sent to [facility]? Their fax number is [phone]

## 2021-12-28 NOTE — Telephone Encounter (Signed)
Per Dr.Jaffe okay to send referral. Referral sent via Epic.

## 2021-12-28 NOTE — Telephone Encounter (Signed)
LMOM for patient to call and schedule follow-up appointment.   °

## 2021-12-28 NOTE — Progress Notes (Signed)
Office Visit Note  Patient: Vanessa Nelson             Date of Birth: September 09, 1995           MRN: [id number]             PCP: Alma Friendly, MD Referring: Alma Friendly, MD Visit Date: 01/05/2022 Occupation: '@GUAROCC' @  Subjective:  Pain in all the muscles and rash  History of Present Illness: Vanessa Nelson is a 26 y.o. female returns today after her initial visit on July 07, 2021.  Was evaluated for positive ANA, positive RNP, history of rash and myalgias.  She states she continues to have fatigue and muscle pain.  She denies any history of malar rash, photosensitivity, Raynaud's phenomenon or lymphadenopathy.  Her main concern is the rash.  She states she gets rash which is very pruritic.  She also gets inflammation in the tattoos on her arms.  She continues to have some muscle pain which is generalized.  She gives history of dry mouth and dry eyes which she relates to medication use.  There is no history of joint swelling.  Activities of Daily Living:  Patient reports morning stiffness for 0 minute.   Patient Reports nocturnal pain.  Difficulty dressing/grooming: Denies Difficulty climbing stairs: Reports Difficulty getting out of chair: Reports Difficulty using hands for taps, buttons, cutlery, and/or writing: Reports  Review of Systems  Constitutional:  Positive for fatigue.  HENT:  Positive for mouth sores and mouth dryness.   Eyes:  Positive for dryness.  Respiratory:  Negative for shortness of breath.   Cardiovascular:  Negative for palpitations.  Gastrointestinal:  Positive for constipation and diarrhea. Negative for blood in stool.  Endocrine: Negative for increased urination.  Genitourinary:  Negative for involuntary urination and nocturia.  Musculoskeletal:  Positive for myalgias and myalgias. Negative for joint pain, joint pain, joint swelling and morning stiffness.  Skin:  Positive for rash. Negative for color change and sensitivity to sunlight.   Allergic/Immunologic: Positive for susceptible to infections.  Neurological:  Negative for dizziness.  Hematological:  Negative for swollen glands.  Psychiatric/Behavioral:  Positive for sleep disturbance. Negative for depressed mood. The patient is not nervous/anxious.     PMFS History:  Patient Active Problem List   Diagnosis Date Noted   Pain, female pelvic 07/01/2017   Bipolar 1 disorder, depressed, mild ([place]) 10/04/2016    Class: Chronic   MDD (major depressive disorder), recurrent severe, without psychosis ([place]) 09/26/2016    Past Medical History:  Diagnosis Date   Anxiety    Fracture of upper end of left tibia 07/06/2014   fell while skateboarding   Insomnia    Mental disorder    bipolar   Migraines     Family History  Problem Relation Age of Onset   Endometriosis Mother    Pancreatic disease Father    Healthy Sister    Migraines Paternal Grandmother    Dementia Paternal Grandmother    Stroke Paternal Grandfather    Past Surgical History:  Procedure Laterality Date   CHONDROPLASTY Left 07/09/2014   Procedure: CHONDROPLASTY;  Surgeon: Renette Butters, MD;  Location: [place];  Service: Orthopedics;  Laterality: Left;   DENTAL SURGERY     for impacted teeth   KNEE ARTHROSCOPY Left 07/09/2014   Procedure: ARTHROSCOPY KNEE;  Surgeon: Renette Butters, MD;  Location: [place];  Service: Orthopedics;  Laterality: Left;   ORIF TIBIA PLATEAU Left 07/09/2014   Procedure: OPEN  REDUCTION INTERNAL FIXATION (ORIF) TIBIAL PLATEAU;  Surgeon: Renette Butters, MD;  Location: [place];  Service: Orthopedics;  Laterality: Left;   TONSILLECTOMY     age 43   UPPER GI ENDOSCOPY  09/10/2019   Social History   Social History Narrative   Not on file    There is no immunization history on file for this patient.   Objective: Vital Signs: BP 132/86 (BP Location: Left Arm, Patient Position: Sitting, Cuff Size: Small)   Pulse 90    Resp 12   Ht '5\' 2"'  (1.575 m)   Wt 178 lb (80.7 kg)   BMI 32.56 kg/m    Physical Exam Vitals and nursing note reviewed.  Constitutional:      Appearance: She is well-developed.  HENT:     Head: Normocephalic and atraumatic.  Eyes:     Conjunctiva/sclera: Conjunctivae normal.  Cardiovascular:     Rate and Rhythm: Normal rate and regular rhythm.     Heart sounds: Normal heart sounds.  Pulmonary:     Effort: Pulmonary effort is normal.     Breath sounds: Normal breath sounds.  Abdominal:     General: Bowel sounds are normal.     Palpations: Abdomen is soft.  Musculoskeletal:     Cervical back: Normal range of motion.  Lymphadenopathy:     Cervical: No cervical adenopathy.  Skin:    General: Skin is warm and dry.     Capillary Refill: Capillary refill takes less than 2 seconds.     Comments: Erythematous dry scales were noted on the eyelids, and over nape of her neck.  Skin was noted on her hands.  No sclerodactyly, Telengectesia or nailbed capillary changes were noted.  Neurological:     Mental Status: She is alert and oriented to person, place, and time.  Psychiatric:        Behavior: Behavior normal.      Musculoskeletal Exam: Cervical, thoracic, lumbar spine were in good range of motion.  There was no SI joint tenderness.  Shoulder joints, elbow joints, wrist joints, MCPs PIPs and DIPs with good range of motion with no synovitis.  Hip joints, knee joints, ankles, MTPs and PIPs with good range of motion with no synovitis.  CDAI Exam: CDAI Score: -- Patient Global: --; Provider Global: -- Swollen: --; Tender: -- Joint Exam 01/05/2022   No joint exam has been documented for this visit   There is currently no information documented on the homunculus. Go to the Rheumatology activity and complete the homunculus joint exam.  Investigation: No additional findings.  Imaging: No results found.  Recent Labs: Lab Results  Component Value Date   WBC 17.3 (H) 07/27/2021    HGB 15.0 07/27/2021   PLT 374 07/27/2021   NA 135 07/27/2021   K 3.5 07/27/2021   CL 103 07/27/2021   CO2 21 (L) 07/27/2021   GLUCOSE 102 (H) 07/27/2021   BUN 8 07/27/2021   CREATININE 0.73 07/27/2021   BILITOT 0.8 07/27/2021   ALKPHOS 84 07/27/2021   AST 27 07/27/2021   ALT 28 07/27/2021   PROT 7.7 07/27/2021   ALBUMIN 4.2 07/27/2021   CALCIUM 9.1 07/27/2021   GFRAA >60 07/25/2018   July 07, 2021 UA showed 2+ leukocytes, many epithelial cells and many bacteria.  C3-C4 normal, CK 69, sed rate 29, RNP 2.1, anti-CCP negative, HLA-B27 negative 04/11/21: ANA negative, dsDNA 1, RNP 2.1, Scl-70-, Ro-, La- 04/05/21: ANA 1:40NS, ACE 22, ESR 45   Speciality  Comments: No specialty comments available.  Procedures:  No procedures performed Allergies: Ibuprofen, Tamiflu [oseltamivir phosphate], Dexamethasone, Flagyl [metronidazole], Latex, Nickel, Oxycodone, and Wellbutrin [bupropion]   Assessment / Plan:     Visit Diagnoses: Positive ANA (antinuclear antibody) - Repeat ANA negative. -Repeat RNP was positive.  Association of RNP with mixed connective tissue disease and Raynauds was discussed.  Patient has no clinical features of mixed connective tissue disease or Raynauds.  I advised patient to contact me if she develops any new symptoms.  Otherwise we will repeat labs in 1 year and reevaluate her in 1 year.  Plan: Protein / creatinine ratio, urine, CBC with Differential/Platelet, COMPLETE METABOLIC PANEL WITH GFR, ANA, Anti-DNA antibody, double-stranded, C3 and C4, Sedimentation rate, RNP Antibody  Positive sm/RNP antibody - RNP positive in November 2022 and February 2023.  There is no history of Raynaud's.  No nailbed capillary changes, decreased capillary refill or sclerodactyly was noted.  Her chest was clear to auscultation.  There is no history of shortness of breath.  Rash -she has been experiencing rash on the nape of her neck.  She had dry scales on her eyelids, nape of her neck  and also some on her hands.  She also states that the tattoos on her forearms have become more inflamed.  She has been followed by the dermatologist in [place].  Hair loss - Over the last few years.  Over-the-counter products were discussed.  Visual disturbance - Patient has been evaluated by several ophthalmologist and your ophthalmologist.  Pain in both hands - No synovitis was noted.  X-rays were unremarkable.  X-ray findings were reviewed with the patient.  Chronic pain of both knees - History of chronic pain and discomfort.  No synovitis was noted.  X-rays were unremarkable.  X-ray findings were reviewed with the patient.  Myalgia-she may have a component of myofascial pain.  She has generalized pain discomfort and positive tender points.  Other fatigue-most likely related to insomnia.  Primary insomnia-good sleep hygiene was discussed.  Other medical problems listed as follows:  Bipolar 1 disorder, depressed, mild ([facility])  History of gastroesophageal reflux (GERD)  Irritable bowel syndrome with both constipation and diarrhea  Family history of psoriasis in mother  Family history of Crohn's disease-paternal first cousin  Orders: Orders Placed This Encounter  Procedures   Protein / creatinine ratio, urine   CBC with Differential/Platelet   COMPLETE METABOLIC PANEL WITH GFR   ANA   Anti-DNA antibody, double-stranded   C3 and C4   Sedimentation rate   RNP Antibody   No orders of the defined types were placed in this encounter.    Follow-Up Instructions: Return in 1 year (on 01/06/2023) for +RNP.   Bo Merino, MD  Note - This record has been created using Editor, commissioning.  Chart creation errors have been sought, but may not always  have been located. Such creation errors do not reflect on  the standard of medical care.

## 2022-01-03 ENCOUNTER — Other Ambulatory Visit: Payer: Self-pay

## 2022-01-03 ENCOUNTER — Emergency Department (HOSPITAL_COMMUNITY)
Admission: EM | Admit: 2022-01-03 | Discharge: 2022-01-03 | Disposition: A | Discharge status: Home / Self Care | Payer: Self-pay | Attending: Emergency Medicine | Admitting: Emergency Medicine

## 2022-01-03 DIAGNOSIS — L259 Unspecified contact dermatitis, unspecified cause: Secondary | ICD-10-CM | POA: Insufficient documentation

## 2022-01-03 DIAGNOSIS — Z91040 Latex allergy status: Secondary | ICD-10-CM | POA: Insufficient documentation

## 2022-01-03 MED ORDER — DIPHENHYDRAMINE-ZINC ACETATE 2-0.1 % EX CREA
TOPICAL_CREAM | Freq: Once | CUTANEOUS | Status: AC
  Filled 2022-01-03: qty 28

## 2022-01-03 MED ORDER — FAMOTIDINE 20 MG PO TABS: 20.0000 mg | ORAL_TABLET | Freq: Once | ORAL | Status: DC

## 2022-01-03 NOTE — ED Provider Notes (Signed)
[facility] EMERGENCY DEPARTMENT Provider Note   CSN: [id number] Arrival date & time: 01/03/22  0250     History  Chief Complaint  Patient presents with   Pruritis   Allergic Reaction    Galit Urich is a 26 y.o. female.  [name] , a 26 y.o. female  was evaluated in triage.  Pt complains of persistent rash which has been waxing and waning, but fairly persistent, x 1 year. Is established with dermatology who have dx with contact dermatitis. She takes Zyrtec BID and uses topical hydrocortisone, but sweating or heat exposure makes rash worse. Patient has been moving residences resulting in worsening of her rash to her neck, back, face. C/o burning pain and sensation that her skin is "tearing". No vomiting, lip/tongue swelling, trouble swallowing, SOB or wheezing.  The history is provided by the patient. No language interpreter was used.  Allergic Reaction      Home Medications Prior to Admission medications   Medication Sig Start Date End Date Taking? Authorizing Provider  cyclobenzaprine (FLEXERIL) 10 MG tablet Take 1 tablet (10 mg total) by mouth 3 (three) times daily as needed for muscle spasms (caution for drowsiness). Patient not taking: Reported on 07/27/2021 04/25/21   Drema Dallas, DO  dicyclomine (BENTYL) 20 MG tablet Take 20 mg by mouth 3 (three) times daily as needed for spasms.  07/30/17 07/27/21  [provider]  ibuprofen (ADVIL) 200 MG tablet Take 200 mg by mouth every 6 (six) hours as needed for mild pain.    [provider]  pantoprazole (PROTONIX) 40 MG tablet Take 40 mg by mouth daily.    [provider]  norgestimate-ethinyl estradiol (ORTHO-CYCLEN,SPRINTEC,PREVIFEM) 0.25-35 MG-MCG tablet Take 1 tablet by mouth daily. 07/01/17 06/22/19  Willodean Rosenthal, MD  omeprazole (PRILOSEC) 20 MG capsule Take 1 capsule (20 mg total) by mouth daily. 07/25/18 06/22/19  Azalia Bilis, MD  promethazine (PHENERGAN) 12.5 MG tablet Take  12.5 mg by mouth every 6 (six) hours as needed for nausea or vomiting.  02/04/18 06/22/19  [provider]  verapamil (VERELAN PM) 120 MG 24 hr capsule Take 120 mg by mouth at bedtime. 04/11/18 06/22/19  [provider]      Allergies    Ibuprofen, Tamiflu [oseltamivir phosphate], Dexamethasone, Flagyl [metronidazole], Latex, Nickel, Oxycodone, and Wellbutrin [bupropion]    Review of Systems   Review of Systems Ten systems reviewed and are negative for acute change, except as noted in the HPI.    Physical Exam Updated Vital Signs BP (!) 129/91   Pulse 78   Temp 98.3 F (36.8 C) (Oral)   Resp 20   SpO2 100%   Physical Exam Vitals and nursing note reviewed.  Constitutional:      General: She is not in acute distress.    Appearance: She is well-developed. She is not diaphoretic.     Comments: Nontoxic appearing and in NAD  HENT:     Head: Normocephalic and atraumatic.     Mouth/Throat:     Comments: Tolerating secretions. Normal phonation. No stridor. Eyes:     General: No scleral icterus.    Conjunctiva/sclera: Conjunctivae normal.  Pulmonary:     Effort: Pulmonary effort is normal. No respiratory distress.  Musculoskeletal:        General: Normal range of motion.     Cervical back: Normal range of motion.  Skin:    General: Skin is warm and dry.     Coloration: Skin is not pale.  Findings: Rash present.     Comments: Erythematous, dry, punctate papular rash noted to inner corners of eyes, posterior neck, forearms  Neurological:     Mental Status: She is alert and oriented to person, place, and time.  Psychiatric:        Behavior: Behavior normal.     ED Results / Procedures / Treatments   Labs (all labs ordered are listed, but only abnormal results are displayed) Labs Reviewed - No data to display  EKG None  Radiology No results found.  Procedures Procedures    Medications Ordered in ED Medications  famotidine (PEPCID) tablet 20 mg  (20 mg Oral Patient Refused/Not Given 01/03/22 0536)  diphenhydrAMINE-zinc acetate (BENADRYL) 2-0.1 % cream ( Topical Given 01/03/22 0536)    ED Course/ Medical Decision Making/ A&P                           Medical Decision Making Risk OTC drugs.   This patient presents to the ED for concern of rash, this involves an extensive number of treatment options, and is a complaint that carries with it a high risk of complications and morbidity.  The differential diagnosis includes dermatitis vs SJS vss EMM vs psoriasis   Co morbidities that complicate the patient evaluation  Anxiety    Medicines ordered and prescription drug management:  I ordered medication including PO Pepcid and topical Benadryl cream for itching/burning  Reevaluation of the patient after these medicines showed that the patient stayed the same I have reviewed the patients home medicines and have made adjustments as needed   Reevaluation:  After the interventions noted above, I reevaluated the patient and found that they have : remained stable   Dispostion:  After consideration of the diagnostic results and the patients response to treatment, I feel that the patent would benefit from outpatient dermatology follow up for further evaluation of this ongoing, chronic rash. No concerning signs/red flags. No criteria for anaphylaxis. Return precautions discussed and provided. Patient discharged in stable condition with no unaddressed concerns.          Final Clinical Impression(s) / ED Diagnoses Final diagnoses:  Contact dermatitis, unspecified contact dermatitis type, unspecified trigger    Rx / DC Orders ED Discharge Orders     None         Antony Madura, PA-C 01/03/22 [id number]    Marily Memos, MD 01/04/22 [phone]

## 2022-01-03 NOTE — ED Provider Triage Note (Signed)
Emergency Medicine Provider Triage Evaluation Note  Vanessa Nelson , a 26 y.o. female  was evaluated in triage.  Pt complains of persistent rash which has been waxing and waning, but fairly persistent, x 1 year. Is established with dermatology who have dx with contact dermatitis. She takes Zyrtec BID and uses topical hydrocortisone, but sweating or heat exposure makes rash worse. Patient has been moving residences resulting in worsening of her rash to her neck, back, face. C/o burning pain and sensation that her skin is "tearing". No vomiting, lip/tongue swelling, trouble swallowing, SOB or wheezing.  Review of Systems  Positive: As above Negative: As above  Physical Exam  BP (!) 134/90 (BP Location: Right Arm)   Pulse 85   Temp 98.3 F (36.8 C) (Oral)   Resp 20   SpO2 97%  Gen:   Awake, no distress   Resp:  Normal effort  MSK:   Moves extremities without difficulty  Other:  Erythematous, dry, punctate papular rash noted to inner corners of eyes, posterior neck, forearms.  Medical Decision Making  Medically screening exam initiated at 3:01 AM.  Appropriate orders placed.  Jontae Sonier was informed that the remainder of the evaluation will be completed by another provider, this initial triage assessment does not replace that evaluation, and the importance of remaining in the ED until their evaluation is complete.  Rash/nonspecific skin eruption. Will attempt medical management pending formal evaluation.   Antony Madura, PA-C 01/03/22 [phone]

## 2022-01-03 NOTE — ED Triage Notes (Signed)
Pt endorse increase itchiness around eyes and back of head. Pt reports that its been on going for months and seen by dermatologist for contact dermatitis. Pt reports previous allergic reaction to steroids.

## 2022-01-03 NOTE — ED Notes (Signed)
Patient verbalizes understanding of discharge instructions. Opportunity for questioning and answers were provided. Armband removed by staff, pt discharged from ED and ambulated to lobby to return home.   

## 2022-01-05 ENCOUNTER — Encounter: Payer: Self-pay | Admitting: Rheumatology

## 2022-01-05 ENCOUNTER — Ambulatory Visit: Payer: No Typology Code available for payment source | Attending: Rheumatology | Admitting: Rheumatology

## 2022-01-05 VITALS — BP 132/86 | HR 90 | Resp 12 | Ht 62.0 in | Wt 178.0 lb

## 2022-01-05 DIAGNOSIS — L659 Nonscarring hair loss, unspecified: Secondary | ICD-10-CM | POA: Diagnosis not present

## 2022-01-05 DIAGNOSIS — M791 Myalgia, unspecified site: Secondary | ICD-10-CM

## 2022-01-05 DIAGNOSIS — R21 Rash and other nonspecific skin eruption: Secondary | ICD-10-CM

## 2022-01-05 DIAGNOSIS — R768 Other specified abnormal immunological findings in serum: Secondary | ICD-10-CM | POA: Diagnosis not present

## 2022-01-05 DIAGNOSIS — G8929 Other chronic pain: Secondary | ICD-10-CM

## 2022-01-05 DIAGNOSIS — R7689 Other specified abnormal immunological findings in serum: Secondary | ICD-10-CM

## 2022-01-05 DIAGNOSIS — H539 Unspecified visual disturbance: Secondary | ICD-10-CM

## 2022-01-05 DIAGNOSIS — M79641 Pain in right hand: Secondary | ICD-10-CM

## 2022-01-05 DIAGNOSIS — F3131 Bipolar disorder, current episode depressed, mild: Secondary | ICD-10-CM

## 2022-01-05 DIAGNOSIS — Z8379 Family history of other diseases of the digestive system: Secondary | ICD-10-CM

## 2022-01-05 DIAGNOSIS — M79642 Pain in left hand: Secondary | ICD-10-CM

## 2022-01-05 DIAGNOSIS — M25562 Pain in left knee: Secondary | ICD-10-CM

## 2022-01-05 DIAGNOSIS — Z8719 Personal history of other diseases of the digestive system: Secondary | ICD-10-CM

## 2022-01-05 DIAGNOSIS — M25561 Pain in right knee: Secondary | ICD-10-CM

## 2022-01-05 DIAGNOSIS — K582 Mixed irritable bowel syndrome: Secondary | ICD-10-CM

## 2022-01-05 DIAGNOSIS — R5383 Other fatigue: Secondary | ICD-10-CM

## 2022-01-05 DIAGNOSIS — Z840 Family history of diseases of the skin and subcutaneous tissue: Secondary | ICD-10-CM

## 2022-01-05 DIAGNOSIS — F5101 Primary insomnia: Secondary | ICD-10-CM

## 2022-01-05 NOTE — Patient Instructions (Signed)
Please return 2 weeks prior to your appointment for labs

## 2022-02-05 ENCOUNTER — Encounter: Payer: Self-pay | Admitting: Rheumatology

## 2022-02-06 NOTE — Telephone Encounter (Signed)
Ok to schedule sooner follow up visit.

## 2022-02-23 ENCOUNTER — Ambulatory Visit (HOSPITAL_COMMUNITY)
Admission: EM | Admit: 2022-02-23 | Discharge: 2022-02-23 | Disposition: A | Discharge status: Home / Self Care | Payer: No Typology Code available for payment source | Attending: Family Medicine | Admitting: Family Medicine

## 2022-02-23 ENCOUNTER — Ambulatory Visit (INDEPENDENT_AMBULATORY_CARE_PROVIDER_SITE_OTHER): Payer: No Typology Code available for payment source

## 2022-02-23 ENCOUNTER — Encounter (HOSPITAL_COMMUNITY): Payer: Self-pay | Admitting: Emergency Medicine

## 2022-02-23 DIAGNOSIS — R0781 Pleurodynia: Secondary | ICD-10-CM | POA: Diagnosis not present

## 2022-02-23 MED ORDER — KETOROLAC TROMETHAMINE 10 MG PO TABS: 10.0000 mg | ORAL_TABLET | Freq: Four times a day (QID) | ORAL | 0 refills | Status: AC | PRN

## 2022-02-23 MED ORDER — KETOROLAC TROMETHAMINE 30 MG/ML IJ SOLN
30.0000 mg | Freq: Once | INTRAMUSCULAR | Status: AC
  Administered 2022-02-23: 30 mg via INTRAMUSCULAR

## 2022-02-23 MED ORDER — KETOROLAC TROMETHAMINE 30 MG/ML IJ SOLN
INTRAMUSCULAR | Status: AC
  Filled 2022-02-23: qty 1

## 2022-02-23 NOTE — ED Triage Notes (Signed)
Pt reports left rib cage pain since Monday. States yesterday the pain got worse. Reports dropping a plant on her left arm two weeks prior and unsure if accident is related. Denies any new injuries. Denies SOB, states just feels tight when trying to breathe.

## 2022-02-23 NOTE — ED Provider Notes (Addendum)
[facility]    CSN: [id number] Arrival date & time: 02/23/22  0175      History   Chief Complaint Chief Complaint  Patient presents with   Rib Cage Pain    HPI Vanessa Nelson is a 26 y.o. female.   HPI Here with left lower rib pain.  It began on September 25.  It is worsened over the last few days, and is hurting more when she twists or coughs or breathes deeply or moves her left arm.  No fever or chills or upper respiratory symptoms.  No rash or tingling or burning  About 10 days prior to this she had caught a heavy hanging basket as it fell and it hurt her shoulder on the upper portion.  That improved and then when that quit hurting then this left lower rib pain began  Last menstrual cycle was about 1 week ago  She is allergic to oxycodone.  Steroids and metronidazole  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    Past Surgical History:  Procedure Laterality Date   CHONDROPLASTY Left 07/09/2014   Procedure: CHONDROPLASTY;  Surgeon: Sheral Apley, MD;  Location: [facility];  Service: Orthopedics;  Laterality: Left;   DENTAL SURGERY     for impacted teeth   KNEE ARTHROSCOPY Left 07/09/2014   Procedure: ARTHROSCOPY KNEE;  Surgeon: Sheral Apley, MD;  Location: [facility];  Service: Orthopedics;  Laterality: Left;   ORIF TIBIA PLATEAU Left 07/09/2014   Procedure: OPEN REDUCTION INTERNAL FIXATION (ORIF) TIBIAL PLATEAU;  Surgeon: Sheral Apley, MD;  Location: [facility];  Service: Orthopedics;  Laterality: Left;    TONSILLECTOMY     age 39   UPPER GI ENDOSCOPY  09/10/2019    OB History     Gravida  0   Para  0   Term  0   Preterm  0   AB  0   Living  0      SAB  0   IAB  0   Ectopic  0   Multiple  0   Live Births  0            Home Medications    Prior to Admission medications   Medication Sig Start Date End Date Taking? Authorizing Provider  ketorolac (TORADOL) 10 MG tablet Take 1 tablet (10 mg total) by mouth every 6 (six) hours as needed (pain). 02/23/22  Yes Mercedes Valeriano, Janace Aris, MD  cyclobenzaprine (FLEXERIL) 10 MG tablet Take 1 tablet (10 mg total) by mouth 3 (three) times daily as needed for muscle spasms (caution for drowsiness). 04/25/21   Drema Dallas, DO  dicyclomine (BENTYL) 20 MG tablet Take 20 mg by mouth 3 (three) times daily as needed for spasms.  07/30/17 01/05/22  [provider]  famotidine (PEPCID) 20 MG tablet Take 20 mg by mouth daily.    [provider]  hydrOXYzine (ATARAX) 10 MG tablet Take 10 mg by mouth 3 (three) times daily as needed.  [provider]  risperiDONE (RISPERDAL) 0.25 MG tablet Take by mouth. 04/27/21   [provider]  norgestimate-ethinyl estradiol (ORTHO-CYCLEN,SPRINTEC,PREVIFEM) 0.25-35 MG-MCG tablet Take 1 tablet by mouth daily. 07/01/17 06/22/19  Willodean Rosenthal, MD  omeprazole (PRILOSEC) 20 MG capsule Take 1 capsule (20 mg total) by mouth daily. 07/25/18 06/22/19  Azalia Bilis, MD  promethazine (PHENERGAN) 12.5 MG tablet Take 12.5 mg by mouth every 6 (six) hours as needed for nausea or vomiting.  02/04/18 06/22/19  [provider]  verapamil (VERELAN PM) 120 MG 24 hr capsule Take 120 mg by mouth at bedtime. 04/11/18 06/22/19  [provider]    Family History Family History  Problem Relation Age of Onset   Endometriosis Mother    Pancreatic disease Father    Healthy Sister    Migraines Paternal Grandmother    Dementia Paternal Grandmother    Stroke Paternal Grandfather      Social History Social History   Tobacco Use   Smoking status: Never    Passive exposure: Past   Smokeless tobacco: Never  Vaping Use   Vaping Use: Never used  Substance Use Topics   Alcohol use: Yes    Comment: socially   Drug use: No     Allergies   Ibuprofen, Tamiflu [oseltamivir phosphate], Dexamethasone, Flagyl [metronidazole], Latex, Nickel, Oxycodone, and Wellbutrin [bupropion]   Review of Systems Review of Systems   Physical Exam Triage Vital Signs ED Triage Vitals  Enc Vitals Group     BP 02/23/22 0833 136/88     Pulse Rate 02/23/22 0833 83     Resp 02/23/22 0833 18     Temp 02/23/22 0833 98.3 F (36.8 C)     Temp Source 02/23/22 0833 Oral     SpO2 02/23/22 0833 97 %     Weight --      Height --      Head Circumference --      Peak Flow --      Pain Score 02/23/22 0831 8     Pain Loc --      Pain Edu? --      Excl. in GC? --    No data found.  Updated Vital Signs BP 136/88 (BP Location: Left Arm)   Pulse 83   Temp 98.3 F (36.8 C) (Oral)   Resp 18   SpO2 97%   Visual Acuity Right Eye Distance:   Left Eye Distance:   Bilateral Distance:    Right Eye Near:   Left Eye Near:    Bilateral Near:     Physical Exam Vitals reviewed.  Constitutional:      General: She is not in acute distress.    Appearance: She is not ill-appearing, toxic-appearing or diaphoretic.  Cardiovascular:     Rate and Rhythm: Normal rate and regular rhythm.     Heart sounds: No murmur heard. Pulmonary:     Effort: Pulmonary effort is normal. No respiratory distress.     Breath sounds: No stridor. No wheezing, rhonchi or rales.  Chest:     Chest wall: Tenderness (left lower posterior axillary line and a little posterior to that) present.  Skin:    Coloration: Skin is not jaundiced or pale.     Findings: No rash.  Neurological:     Mental Status: She is alert and oriented to person, place, and time.  Psychiatric:        Behavior: Behavior normal.       UC Treatments / Results  Labs (all labs ordered are listed, but only abnormal results are displayed) Labs Reviewed - No data to display  EKG   Radiology DG Ribs Unilateral W/Chest Left  Result Date: 02/23/2022 CLINICAL DATA:  26 year old female presenting for evaluation of pain along lower ribs since Monday. EXAM: LEFT RIBS AND CHEST - 3+ VIEW COMPARISON:  June 17, 2018 FINDINGS: Trachea midline. Cardiomediastinal contours and hilar structures are normal. Lungs are clear. No pneumothorax. No pleural effusion. No displaced rib fractures. Bandlike sclerosis in the LEFT lateral seventh rib without periosteal reaction. This has well-defined margins. Skeletal structures otherwise unremarkable on limited assessment. IMPRESSION: No displaced rib fracture or acute cardiopulmonary disease. Bandlike sclerosis in LEFT lateral seventh rib may represent more remote injury or small benign lesion. Electronically Signed   By: Zetta Bills M.D.   On: 02/23/2022 09:08    Procedures Procedures (including critical care time)  Medications Ordered in UC Medications  ketorolac (TORADOL) 30 MG/ML injection 30 mg (has no administration in time range)    Initial Impression / Assessment and Plan / UC Course  I have reviewed the triage vital signs and the nursing notes.  Pertinent labs & imaging results that were available during my care of the patient were reviewed by me and considered in my medical decision making (see chart for details).       X-ray does not show any fractures, but there is a bandlike area of sclerosis that radiology states could be an old injury.  We are going to treat the pain with Toradol injection and pills.  She has tried a muscle relaxer at home that did not help, but it did make her sleepy so she might take that at night or 2 to help her rest better.  If not improving she is going to follow-up with her rheumatologist  Ibuprofen causes nausea and vomiting, but she  states she has tolerated Toradol before  Final Clinical Impressions(s) / UC Diagnoses   Final diagnoses:  Rib pain on left side     Discharge Instructions      There was a scarlike area on one of your ribs on the x-ray.    You have been given a shot of Toradol 30 mg today.  Ketorolac 10 mg(generic for toradol tablets)--1 every 6 hours as needed for pain  You can take one of your cyclobenzaprine's at bedtime to help rest.  Contact your rheumatologist if this is not improving quickly     ED Prescriptions     Medication Sig Dispense Auth. Provider   ketorolac (TORADOL) 10 MG tablet Take 1 tablet (10 mg total) by mouth every 6 (six) hours as needed (pain). 20 tablet Valisha Heslin, Gwenlyn Perking, MD      PDMP not reviewed this encounter.   Barrett Henle, MD 02/23/22 [id number]    Barrett Henle, MD 02/23/22 [phone]

## 2022-02-23 NOTE — Discharge Instructions (Addendum)
There was a scarlike area on one of your ribs on the x-ray.    You have been given a shot of Toradol 30 mg today.  Ketorolac 10 mg(generic for toradol tablets)--1 every 6 hours as needed for pain  You can take one of your cyclobenzaprine's at bedtime to help rest.  Contact your rheumatologist if this is not improving quickly

## 2022-03-05 NOTE — Progress Notes (Unsigned)
Office Visit Note  Patient: Vanessa Nelson             Date of Birth: [birth year]           MRN: [id number]             PCP: Vernona Rieger, MD Referring: Vernona Rieger, MD Visit Date: 03/06/2022 Occupation: @GUAROCC @  Subjective:  Numbness in both hands   History of Present Illness: Vanessa Nelson is a 26 y.o. female with history of positive ANA and positive RNP antibody.  Patient was last seen in the office on 01/05/2022 and was evaluated by Dr. 03/07/2022.  The plan at her last office visit was to have repeat lab work and a follow-up visit in 1 years since she was not experiencing any signs or symptoms of autoimmune disease.  The patient sent a message on my chart on 02/05/2022 stating that she was experiencing progressive numbness in both hands and both feet.  She states that the numbness in her hands has been constant.  Patient reports that the numbness is worse with positional changes especially while gripping as well as at night.  She states that at times her fingertips turn bright red but she has not noticed any other color changes.  She denies any skin tightness or thickening.  She denies any digital ulcerations or signs of gangrene.  She denies any new rashes.  Patient reports that in the past she was prescribed verapamil which seemed to help her symptoms. Patient was seen at urgent care on 02/23/2022 for left-sided rib pain.  X-rays of the left ribs was ordered on 02/23/2022 which revealed bandlike sclerosis in the left lateral seventh rib may represent more remote injury or small benign lesion.  According to the patient she was advised to follow-up with 02/25/2022 for further evaluation. She continues to have chronic pain in both knees especially the left knee joint.  She also has had increased discomfort in both hips especially when lying on her sides at night.  Activities of Daily Living:  Patient reports morning stiffness for 1 hour.   Patient Reports nocturnal pain.  Difficulty  dressing/grooming: Reports Difficulty climbing stairs: Reports Difficulty getting out of chair: Reports Difficulty using hands for taps, buttons, cutlery, and/or writing: Reports  Review of Systems  Constitutional:  Positive for fatigue.  HENT:  Negative for mouth sores and mouth dryness.   Eyes:  Positive for dryness.  Respiratory:  Negative for shortness of breath.   Cardiovascular:  Positive for chest pain. Negative for palpitations.  Gastrointestinal:  Negative for blood in stool, constipation and diarrhea.  Endocrine: Negative for increased urination.  Genitourinary:  Negative for involuntary urination.  Musculoskeletal:  Positive for joint pain, gait problem, joint pain, joint swelling, myalgias, muscle weakness, morning stiffness, muscle tenderness and myalgias.  Skin:  Positive for color change, rash and sensitivity to sunlight. Negative for hair loss.  Allergic/Immunologic: Positive for susceptible to infections.  Neurological:  Positive for numbness. Negative for dizziness and headaches.  Hematological:  Positive for swollen glands.  Psychiatric/Behavioral:  Positive for sleep disturbance. Negative for depressed mood. The patient is not nervous/anxious.     PMFS History:  Patient Active Problem List   Diagnosis Date Noted   Pain, female pelvic 07/01/2017   Bipolar 1 disorder, depressed, mild (HCC) 10/04/2016    Class: Chronic   MDD (major depressive disorder), recurrent severe, without psychosis (HCC) 09/26/2016    Past Medical History:  Diagnosis Date   Anxiety    Fracture  of upper end of left tibia 07/06/2014   fell while skateboarding   Insomnia    Mental disorder    bipolar   Migraines     Family History  Problem Relation Age of Onset   Endometriosis Mother    Pancreatic disease Father    Healthy Sister    Migraines Paternal Grandmother    Dementia Paternal Grandmother    Stroke Paternal Grandfather    Past Surgical History:  Procedure Laterality Date    CHONDROPLASTY Left 07/09/2014   Procedure: CHONDROPLASTY;  Surgeon: Sheral Apley, MD;  Location: [facility];  Service: Orthopedics;  Laterality: Left;   DENTAL SURGERY     for impacted teeth   KNEE ARTHROSCOPY Left 07/09/2014   Procedure: ARTHROSCOPY KNEE;  Surgeon: Sheral Apley, MD;  Location: [facility];  Service: Orthopedics;  Laterality: Left;   ORIF TIBIA PLATEAU Left 07/09/2014   Procedure: OPEN REDUCTION INTERNAL FIXATION (ORIF) TIBIAL PLATEAU;  Surgeon: Sheral Apley, MD;  Location: [facility];  Service: Orthopedics;  Laterality: Left;   TONSILLECTOMY     age 11   UPPER GI ENDOSCOPY  09/10/2019   Social History   Social History Narrative   Not on file    There is no immunization history on file for this patient.   Objective: Vital Signs: BP 132/72 (BP Location: Left Arm, Patient Position: Sitting, Cuff Size: Large)   Pulse (!) 109   Resp 12   Ht 5\' 2"  (1.575 m)   Wt 180 lb (81.6 kg)   BMI 32.92 kg/m    Physical Exam Vitals and nursing note reviewed.  Constitutional:      Appearance: She is well-developed.  HENT:     Head: Normocephalic and atraumatic.  Eyes:     Conjunctiva/sclera: Conjunctivae normal.  Cardiovascular:     Rate and Rhythm: Normal rate and regular rhythm.     Heart sounds: Normal heart sounds.  Pulmonary:     Effort: Pulmonary effort is normal.     Breath sounds: Normal breath sounds.  Abdominal:     General: Bowel sounds are normal.     Palpations: Abdomen is soft.  Musculoskeletal:     Cervical back: Normal range of motion.  Skin:    General: Skin is warm and dry.     Capillary Refill: Capillary refill takes less than 2 seconds.  Neurological:     Mental Status: She is alert and oriented to person, place, and time.  Psychiatric:        Behavior: Behavior normal.      Musculoskeletal Exam: C-spine, thoracic spine, lumbar spine have good range of motion.  Shoulder joints have good  range of motion with some discomfort and tenderness over the left shoulder.  Elbow joints have good range of motion with no tenderness or synovitis.  Wrist joints, MCPs, PIPs, DIPs have good range of motion with no synovitis.  Complete fist formation noted bilaterally.  Hip joints have good range of motion with no groin pain.  Tenderness over bilateral trochanteric bursa.  Painful range of motion of the left knee joint.  Ankle joints have good range of motion with no tenderness or joint swelling.  CDAI Exam: CDAI Score: -- Patient Global: --; Provider Global: -- Swollen: --; Tender: -- Joint Exam 03/06/2022   No joint exam has been documented for this visit   There is currently no information documented on the homunculus. Go to the Rheumatology activity and complete the  homunculus joint exam.  Investigation: No additional findings.  Imaging: DG Ribs Unilateral W/Chest Left  Result Date: 02/23/2022 CLINICAL DATA:  26 year old female presenting for evaluation of pain along lower ribs since Monday. EXAM: LEFT RIBS AND CHEST - 3+ VIEW COMPARISON:  June 17, 2018 FINDINGS: Trachea midline. Cardiomediastinal contours and hilar structures are normal. Lungs are clear. No pneumothorax. No pleural effusion. No displaced rib fractures. Bandlike sclerosis in the LEFT lateral seventh rib without periosteal reaction. This has well-defined margins. Skeletal structures otherwise unremarkable on limited assessment. IMPRESSION: No displaced rib fracture or acute cardiopulmonary disease. Bandlike sclerosis in LEFT lateral seventh rib may represent more remote injury or small benign lesion. Electronically Signed   By: Zetta Bills M.D.   On: 02/23/2022 09:08    Recent Labs: Lab Results  Component Value Date   WBC 17.3 (H) 07/27/2021   HGB 15.0 07/27/2021   PLT 374 07/27/2021   NA 135 07/27/2021   K 3.5 07/27/2021   CL 103 07/27/2021   CO2 21 (L) 07/27/2021   GLUCOSE 102 (H) 07/27/2021   BUN 8  07/27/2021   CREATININE 0.73 07/27/2021   BILITOT 0.8 07/27/2021   ALKPHOS 84 07/27/2021   AST 27 07/27/2021   ALT 28 07/27/2021   PROT 7.7 07/27/2021   ALBUMIN 4.2 07/27/2021   CALCIUM 9.1 07/27/2021   GFRAA >60 07/25/2018    Speciality Comments: No specialty comments available.  Procedures:  No procedures performed Allergies: Ibuprofen, Tamiflu [oseltamivir phosphate], Dexamethasone, Flagyl [metronidazole], Latex, Nickel, Oxycodone, and Wellbutrin [bupropion]   Assessment / Plan:     Visit Diagnoses: Positive ANA (antinuclear antibody) - ANA 1:40NS on 04/05/21.  Repeat ANA negative on 04/11/2021.  RNP positive on 04/11/2021 and repeat was positive on 07/07/2021.  Patient was last seen in the office on 01/05/2022 at which time she had no signs or symptoms of mixed connective tissue disease.  No signs of sclerodactyly were noted.  She did not have any symptoms of Raynaud's phenomenon.  No capillary bed changes or digital ulcerations were noted.  She had no signs of synovitis on exam.  No immunosuppressive agents were recommended at that time. Patient reached out via [facility] on 02/05/2022 due to experiencing increased numbness in her fingertips and toes.  According to the patient the numbness in her fingertips has been constant but is exacerbated by positional changes especially while gripping.  She has not noticed any triphasic color changes.  According to the patient she was previously treated with verapamil but discontinued when her symptoms resolved.  On examination today no cyanosis, sclerodactyly, or digital ulcerations were noted.  Her fingertips were warm to the touch and had good capillary refill less than 2 seconds.  It is unclear if her symptoms are secondary to Raynaud's phenomenon or a neurologic issue given no triphasic color changes as well as worsening symptoms with positional changes.  Recommend a referral to neurology for further evaluation. The following lab work will be obtained  today for further evaluation as well.  She was advised to notify us if she develops any new or worsening symptoms. - Plan: ANA, RNP Antibody, Sedimentation rate, Cardiolipin antibodies, IgG, IgM, IgA, Beta-2 glycoprotein antibodies, Lupus Anticoagulant Eval w/Reflex, Anti-scleroderma antibody  Positive sm/RNP antibody - RNP positive in November 2022 and February 2023.  No signs of sclerodactyly were noted on examination today.  No digital ulcerations or signs of gangrene were noted.  Patient presents today with questionable symptoms of Raynaud's phenomenon.  She has not noticed  any triphasic color changes but at times notices bright redness in her fingertips.  She had good capillary refill less than 2 seconds on examination today.  Recommend a work-up by neurology as well as obtaining updated lab work today for further evaluation.- Plan: RNP Antibody  Rash: Neck and eyelids-followed by Dermatology in [place].   Hair loss: No change.  Visual disturbance - Patient has been evaluated by several ophthalmologist in the past.  Pain in both hands -Anti-CCP negative on 07/07/2021.  RF negative on 04/11/2021.  X-rays were unremarkable.  She has no synovitis on examination today.   She presents today with with increased pain and numbness in both hands.  According to the patient the diminished sensation in her hands has been constant.  She notices worsening of symptoms with gripping as well as at night.  She has also noticed that her fingertips turn red but she has not noticed any triphasic color changes with exposure to cold. According to the patient she was previously treated with verapamil. It remains unclear if her symptoms are more neurologic versus an issue with circulation.  On examination today her capillary refill was less than 2 seconds.  No nailbed capillary changes were noted.  No signs of sclerodactyly were noted.  No digital ulcerations or signs of gangrene were noted.  No cyanosis of the  fingertips was noted. Recommend following up with neurology to rule out a neurologic cause for the paresthesias in her hands.  Referral to [facility] neurology will be placed today for further evaluation.  Paresthesia of both hands - She has been experiencing increased numbness in both hands for the past 1 month.  Referral to neurology was placed today for further evaluation.  She will likely require NCV with EMG to rule out a neurologic cause.  It is unclear if her symptoms are secondary to Raynaud's since she has not had triphasic color changes.  The paresthesias in her hands is worse with positional changes especially while gripping.  Plan: Ambulatory referral to Neurology  Chronic pain of both knees - X-rays were unremarkable.  Left knee arthroscopic surgery in the past.  Good range of motion of both knee joints with discomfort in the left knee.  No knee joint effusion noted on examination today.  Myalgia: She continues to experience intermittent myalgias and muscle tenderness consistent with myofascial pain.  Other fatigue: Chronic, stable.  Abnormal x-ray: Ribs unilateral left chest: 02/23/2022-bandlike sclerosis in left lateral seventh rib may represent more remote injury or small benign lesion.  She continues to experience discomfort on the left side of her ribs.  She has been taking ibuprofen for symptomatic relief.  Reviewed x-ray results with Dr. Corliss Skains today in the office.  Recommended evaluation by orthopedics or her PCP.    Other medical conditions are listed as follows:  Primary insomnia  Bipolar 1 disorder, depressed, mild (HCC)  Irritable bowel syndrome with both constipation and diarrhea  History of gastroesophageal reflux (GERD)  Family history of Crohn's disease-paternal first cousin  Family history of psoriasis in mother    Orders: Orders Placed This Encounter  Procedures   ANA   RNP Antibody   Sedimentation rate   Cardiolipin antibodies, IgG, IgM, IgA    Beta-2 glycoprotein antibodies   Lupus Anticoagulant Eval w/Reflex   Anti-scleroderma antibody   Ambulatory referral to Neurology   No orders of the defined types were placed in this encounter.   Follow-Up Instructions: Return in about 2 months (around 05/06/2022) for +ANA.  Ofilia Neas, PA-C  Note - This record has been created using Dragon software.  Chart creation errors have been sought, but may not always  have been located. Such creation errors do not reflect on  the standard of medical care.

## 2022-03-06 ENCOUNTER — Ambulatory Visit: Payer: No Typology Code available for payment source | Attending: Physician Assistant | Admitting: Physician Assistant

## 2022-03-06 ENCOUNTER — Encounter: Payer: Self-pay | Admitting: Physician Assistant

## 2022-03-06 VITALS — BP 132/72 | HR 109 | Resp 12 | Ht 62.0 in | Wt 180.0 lb

## 2022-03-06 DIAGNOSIS — L659 Nonscarring hair loss, unspecified: Secondary | ICD-10-CM | POA: Diagnosis not present

## 2022-03-06 DIAGNOSIS — R21 Rash and other nonspecific skin eruption: Secondary | ICD-10-CM

## 2022-03-06 DIAGNOSIS — F5101 Primary insomnia: Secondary | ICD-10-CM

## 2022-03-06 DIAGNOSIS — R5383 Other fatigue: Secondary | ICD-10-CM

## 2022-03-06 DIAGNOSIS — R768 Other specified abnormal immunological findings in serum: Secondary | ICD-10-CM

## 2022-03-06 DIAGNOSIS — Z8379 Family history of other diseases of the digestive system: Secondary | ICD-10-CM

## 2022-03-06 DIAGNOSIS — M25562 Pain in left knee: Secondary | ICD-10-CM

## 2022-03-06 DIAGNOSIS — M79641 Pain in right hand: Secondary | ICD-10-CM

## 2022-03-06 DIAGNOSIS — K582 Mixed irritable bowel syndrome: Secondary | ICD-10-CM

## 2022-03-06 DIAGNOSIS — R9389 Abnormal findings on diagnostic imaging of other specified body structures: Secondary | ICD-10-CM

## 2022-03-06 DIAGNOSIS — H539 Unspecified visual disturbance: Secondary | ICD-10-CM | POA: Diagnosis not present

## 2022-03-06 DIAGNOSIS — G8929 Other chronic pain: Secondary | ICD-10-CM

## 2022-03-06 DIAGNOSIS — F3131 Bipolar disorder, current episode depressed, mild: Secondary | ICD-10-CM

## 2022-03-06 DIAGNOSIS — Z840 Family history of diseases of the skin and subcutaneous tissue: Secondary | ICD-10-CM

## 2022-03-06 DIAGNOSIS — M79642 Pain in left hand: Secondary | ICD-10-CM

## 2022-03-06 DIAGNOSIS — R202 Paresthesia of skin: Secondary | ICD-10-CM

## 2022-03-06 DIAGNOSIS — M791 Myalgia, unspecified site: Secondary | ICD-10-CM

## 2022-03-06 DIAGNOSIS — M25561 Pain in right knee: Secondary | ICD-10-CM

## 2022-03-06 DIAGNOSIS — Z8719 Personal history of other diseases of the digestive system: Secondary | ICD-10-CM

## 2022-03-07 NOTE — Progress Notes (Signed)
ESR WNL

## 2022-03-08 ENCOUNTER — Other Ambulatory Visit: Payer: Self-pay | Admitting: *Deleted

## 2022-03-08 DIAGNOSIS — R202 Paresthesia of skin: Secondary | ICD-10-CM

## 2022-03-08 NOTE — Progress Notes (Signed)
Patient contacted the office stating she checked with the neurology office and they did need a new referral. Referral placed.

## 2022-03-11 LAB — ANTI-NUCLEAR AB-TITER (ANA TITER): ANA Titer 1: 1:80 {titer} — ABNORMAL HIGH

## 2022-03-11 LAB — CARDIOLIPIN ANTIBODIES, IGG, IGM, IGA
Anticardiolipin IgA: 3.3 APL-U/mL (ref <20.0)
Anticardiolipin IgG: <2 GPL-U/mL (ref <20.0)
Anticardiolipin IgM: <2 MPL-U/mL (ref <20.0)

## 2022-03-11 LAB — BETA-2 GLYCOPROTEIN ANTIBODIES
Beta-2 Glyco 1 IgA: <2 U/mL (ref <20.0)
Beta-2 Glyco 1 IgM: 2 U/mL (ref <20.0)
Beta-2 Glyco I IgG: <2 U/mL (ref <20.0)

## 2022-03-11 LAB — SEDIMENTATION RATE: Sed Rate: 14 mm/h (ref 0–20)

## 2022-03-11 LAB — RNP ANTIBODY: Ribonucleic Protein(ENA) Antibody, IgG: 2.4 AI — AB

## 2022-03-11 LAB — LUPUS ANTICOAGULANT EVAL W/ REFLEX
PTT-LA Screen: 33 s (ref <=40)
dRVVT: 40 s (ref <=45)

## 2022-03-11 LAB — ANTI-SCLERODERMA ANTIBODY: Scleroderma (Scl-70) (ENA) Antibody, IgG: <1 AI

## 2022-03-11 LAB — ANA: Anti Nuclear Antibody (ANA): POSITIVE — AB

## 2022-03-12 NOTE — Progress Notes (Signed)
ANA and RNP remain positive.    Scl-70 negative.   Anticardiolipin antibodies, beta-2 glycoproteins, and lupus anticoagulant not detected.

## 2022-03-13 ENCOUNTER — Encounter: Payer: Self-pay | Admitting: Rheumatology

## 2022-05-08 NOTE — Progress Notes (Addendum)
NEUROLOGY FOLLOW UP OFFICE NOTE  Vanessa Nelson [id number]  Assessment/Plan:   Right ocular pain and vision loss - semiology consistent with optic neuritis, however complete neurological and neuro-ophthalmological workup is negative based on eye exam, MRI brain/orbits/spinal cord with contrast as well as CSF analysis and serum labs.  Blood work has revealed positive ANA and mildly elevated sed rate with negative ENA except for positive RNP antibodies.  Whether ocular symptoms related to unknown underlying autoimmune disease is unknown as objective testing has been unremarkable.  Other consideration is a retinal etiology, therefore, her ophthalmologist has referred her to a retina specialist. Pain and paresthesias - evaluate for underlying peripheral neuropathy  Check NCV-EMG of right upper and lower extremities.  If negative, would pursue punch skin biopsy Check labs for alternative causes of paresthesias/neuropathy - B1, B6, B12, TSH, IFE, heavy metal screen Follow up with the retina specialist.   Follow up after testing.  07/05/2022 ADDENDUM:  Workup for neuropathy is normal.  Labs from 05/09/2022 revealed ACE of 22, normal IFE, B6 5.5 and borderline low B1 of 7.  NCV-EMG of right upper and lower extremities showed evidence of a possible mild right carpal tunnel syndrome but no evidence of radiculopathy or large-fiber sensorimotor polyneuropathy.  Punch skin biopsy on 06/26/2022 was normal.  I don't have a neurologic explanation for her paresthesias.    Shon Millet, DO    Subjective:  Vanessa Nelson is a 26 year old right-handed female with migraines and Bipolar disorder who follows up for visual disturbance.  MRI of cervical spine personally reviewed.   UPDATE:  Due to positive ANA, she was referred to rheumatology, Dr. Corliss Skains.   Underwent extensive blood work by rheum in February 2023.  Sed rate 29, RNP antibody positive at 2.1, C3 172, C4 30, CCP antibody negative, HLA-B27 antigen was  negative, CK 69.  She started experiencing other symptoms such as progressive paresthesias in the hands (worse in the right) and feet and soreness in her legs as well as pain in the knees and hips.  Exhibiting symptoms consistent with Raynaud's.  Notes twitching on the left side of her face and right eye.  Repeat blood work in October 2023 revealed positive ANA 1:80 (fine speckled), sed rate 14, positive RNP antibody 2.4, cardiolipin antibodies 3.3, beta-2 glycoprotein antibodies negative, lupus anticoagulant negative, and Scl-70 antibody negative. Due to further elevation of ANA titer and RNP antibody, a connective tissue disease is now presumed.  She was seen in Urgent Care in September for left sided rib pain.  X-ray of left ribs revealed bandlike sclerosis in the left lateral seventh rib possibly reflecting remote injury or small benign lesion.      She saw Dr. Daphine Deutscher, neuro-ophthalmology at [facility], in June.  While she again demonstrated right nasal visual field deficit in the right eye, objective testing was negative, without evidence of acute or remote optic neuropathy.  He suggested retinal specialist.  Her ophthalmologist, Dr. Sherrine Maples, has referred her. She continues to have vision deficits in the right eye.  She particularly can't see in the dark.  She also has started seeing flashes as well, also more prominent in the dark.  No headaches.      HISTORY: Around 03/06/2021, she became sick with nausea and vomiting.  Afterwards, she developed blurred vision involving the nasal aspect of her right eye.  Over the next couple of days, it progressed to involve the entire right visual field as well as color desaturation.  She also  reported right ocular pain and right sided headache.  She went to the ED on 03/09/2021 for further evaluation.  MRI of brain and orbits with and without contrast were unremarkable for evidence of MS or optic neuritis.  MRI of thoracic spine was unremarkable.  She saw ophthalmology on  03/17/2021 demonstrated OD nasal depression respecting the vertical midline on HVF but no evidence of optic nerve or disc edema.  Blood work on 04/05/2021 revealed negative NMO antibody, mildly elevated sed rate 45, negative RF, ANA borderline positive with 1:40 titer nuclear, speckled,  Follow up ANA with ENA panel on 04/11/2021 demonstrated positive ANA 1:40 and ENA with positive RNP ab 2.1 but otherwise negative.  MRI of cervical spine with and without contrast on 04/10/2021 was normal.  Underwent LP on 04/14/2021, which revealed CSF cell count negative, protein 21, glucose 59, 0 oligoclonal bands, IgG index 2, negative, gram stain and culture, negative cytology, and negative ACE.   FH: migraines but not MS  PAST MEDICAL HISTORY: Past Medical History:  Diagnosis Date   Anxiety    Fracture of upper end of left tibia 07/06/2014   fell while skateboarding   Insomnia    Mental disorder    bipolar   Migraines     MEDICATIONS: Current Outpatient Medications on File Prior to Visit  Medication Sig Dispense Refill   cyclobenzaprine (FLEXERIL) 10 MG tablet Take 1 tablet (10 mg total) by mouth 3 (three) times daily as needed for muscle spasms (caution for drowsiness). 90 tablet 3   dicyclomine (BENTYL) 20 MG tablet Take 20 mg by mouth 3 (three) times daily as needed for spasms.      famotidine (PEPCID) 20 MG tablet Take 20 mg by mouth daily.     hydrOXYzine (ATARAX) 10 MG tablet Take 10 mg by mouth 3 (three) times daily as needed. (Patient not taking: Reported on 03/06/2022)     ketorolac (TORADOL) 10 MG tablet Take 1 tablet (10 mg total) by mouth every 6 (six) hours as needed (pain). (Patient not taking: Reported on 03/06/2022) 20 tablet 0   risperiDONE (RISPERDAL) 0.25 MG tablet Take by mouth.     [DISCONTINUED] norgestimate-ethinyl estradiol (ORTHO-CYCLEN,SPRINTEC,PREVIFEM) 0.25-35 MG-MCG tablet Take 1 tablet by mouth daily. 1 Package 11   [DISCONTINUED] omeprazole (PRILOSEC) 20 MG capsule Take 1  capsule (20 mg total) by mouth daily. 30 capsule 0   [DISCONTINUED] promethazine (PHENERGAN) 12.5 MG tablet Take 12.5 mg by mouth every 6 (six) hours as needed for nausea or vomiting.      [DISCONTINUED] verapamil (VERELAN PM) 120 MG 24 hr capsule Take 120 mg by mouth at bedtime.     No current facility-administered medications on file prior to visit.    ALLERGIES: Allergies  Allergen Reactions   Ibuprofen Nausea And Vomiting    Other reaction(s): Abdominal Pain   Tamiflu [Oseltamivir Phosphate] Nausea And Vomiting and Rash   Dexamethasone Hives   Flagyl [Metronidazole] Rash   Latex Other (See Comments)    Makes skin raw   Nickel Rash   Oxycodone Nausea And Vomiting   Wellbutrin [Bupropion] Nausea Only and Other (See Comments)    Patient had insomnia and nausea and she could not    FAMILY HISTORY: Family History  Problem Relation Age of Onset   Endometriosis Mother    Pancreatic disease Father    Healthy Sister    Migraines Paternal Grandmother    Dementia Paternal Grandmother    Stroke Paternal Grandfather  Objective:  Blood pressure 126/84, pulse (!) 109, height 5\' 2"  (1.575 m), weight 178 lb 3.2 oz (80.8 kg), SpO2 98 %. General: No acute distress.  Patient appears well-groomed.   Head:  Normocephalic/atraumatic Eyes:  Fundi examined but not visualized Neck: supple, no paraspinal tenderness, full range of motion Heart:  Regular rate and rhythm Lungs:  Clear to auscultation bilaterally Back: No paraspinal tenderness Neurological Exam: alert and oriented to person, place, and time.  Speech fluent and not dysarthric, language intact.  Upper and lower quadrant nasal vision loss in right eye.  Otherwise, CN II-XII intact. Bulk and tone normal, muscle strength 5/5 throughout.  Sensation to pinprick reduced in fingers of right hand.   Vibratory sensation intact.  Deep tendon reflexes 2+ throughout, toes downgoing.  Finger to nose testing intact.  Gait normal, Romberg  negative.   , DO

## 2022-05-09 ENCOUNTER — Other Ambulatory Visit (INDEPENDENT_AMBULATORY_CARE_PROVIDER_SITE_OTHER): Payer: No Typology Code available for payment source

## 2022-05-09 ENCOUNTER — Ambulatory Visit: Payer: No Typology Code available for payment source | Admitting: Neurology

## 2022-05-09 ENCOUNTER — Encounter: Payer: Self-pay | Admitting: Neurology

## 2022-05-09 VITALS — BP 126/84 | HR 109 | Ht 62.0 in | Wt 178.2 lb

## 2022-05-09 DIAGNOSIS — R200 Anesthesia of skin: Secondary | ICD-10-CM | POA: Diagnosis not present

## 2022-05-09 DIAGNOSIS — R202 Paresthesia of skin: Secondary | ICD-10-CM | POA: Diagnosis not present

## 2022-05-09 DIAGNOSIS — R52 Pain, unspecified: Secondary | ICD-10-CM | POA: Diagnosis not present

## 2022-05-09 DIAGNOSIS — H5461 Unqualified visual loss, right eye, normal vision left eye: Secondary | ICD-10-CM | POA: Diagnosis not present

## 2022-05-09 NOTE — Patient Instructions (Signed)
Check nerve conduction study of right arm and leg Check other labs for neuropathy:  B1, B6, B12, TSH, IFE, ACE Follow up with retina specialist - request notes sent to me as well Follow up after testing.

## 2022-05-10 LAB — TSH: TSH: 1.4 mIU/L

## 2022-05-15 NOTE — Progress Notes (Addendum)
Office Visit Note  Patient: Vanessa Nelson             Date of Birth: Feb 08, 1996           MRN: [id number]             PCP: Patient, No Pcp Per Referring: Vernona Rieger, MD Visit Date: 05/29/2022 Occupation: @GUAROCC @  Subjective:  Pain and discoloration in both hands  History of Present Illness: Vanessa Nelson is a 26 y.o. female with history of low titer positive ANA and positive RNP.  She returns today for the follow-up visit.  She states that she continues to have numbness in her bilateral hands and her bilateral feet.  She is seeing a neurologist.  She also has noticed that her discoloration in her hands is getting worse.  She states her hands and feet turn bright red.  She has not noticed any digital ulcers.  There is no history of oral ulcers, nasal ulcers, malar rash, photosensitivity or lymphadenopathy.  There is no history of inflammatory arthritis.  She continues to experience generalized muscle pain.  She gives history of fatigue and joint pain.    Activities of Daily Living:  Patient reports morning stiffness for 2 hours.   Patient Reports nocturnal pain.  Difficulty dressing/grooming: Denies Difficulty climbing stairs: Reports Difficulty getting out of chair: Reports Difficulty using hands for taps, buttons, cutlery, and/or writing: Reports  Review of Systems  Constitutional:  Positive for fatigue.  HENT:  Positive for mouth dryness. Negative for mouth sores.   Eyes:  Positive for dryness.  Respiratory:  Negative for difficulty breathing.   Cardiovascular:  Negative for chest pain and palpitations.  Gastrointestinal:  Negative for blood in stool, constipation and diarrhea.  Endocrine: Negative for increased urination.  Genitourinary:  Negative for involuntary urination.  Musculoskeletal:  Positive for joint pain, joint pain, joint swelling, myalgias, morning stiffness, muscle tenderness and myalgias. Negative for gait problem and muscle weakness.  Skin:  Positive for  color change. Negative for rash, hair loss and sensitivity to sunlight.  Allergic/Immunologic: Positive for susceptible to infections.  Neurological:  Negative for dizziness and headaches.  Hematological:  Negative for swollen glands.  Psychiatric/Behavioral:  Negative for depressed mood and sleep disturbance. The patient is not nervous/anxious.     PMFS History:  Patient Active Problem List   Diagnosis Date Noted   Irritable bowel syndrome with both constipation and diarrhea 05/29/2022   History of gastroesophageal reflux (GERD) 05/29/2022   Pain, female pelvic 07/01/2017   Bipolar 1 disorder, depressed, mild (HCC) 10/04/2016    Class: Chronic   MDD (major depressive disorder), recurrent severe, without psychosis (HCC) 09/26/2016    Past Medical History:  Diagnosis Date   Anxiety    Fracture of upper end of left tibia 07/06/2014   fell while skateboarding   Insomnia    Mental disorder    bipolar   Migraines     Family History  Problem Relation Age of Onset   Endometriosis Mother    Pancreatic disease Father    Healthy Sister    Migraines Paternal Grandmother    Dementia Paternal Grandmother    Stroke Paternal Grandfather    Past Surgical History:  Procedure Laterality Date   CHONDROPLASTY Left 07/09/2014   Procedure: CHONDROPLASTY;  Surgeon: 09/07/2014, MD;  Location: [facility];  Service: Orthopedics;  Laterality: Left;   DENTAL SURGERY     for impacted teeth   KNEE ARTHROSCOPY Left 07/09/2014  Procedure: ARTHROSCOPY KNEE;  Surgeon: Sheral Apley, MD;  Location: [facility];  Service: Orthopedics;  Laterality: Left;   ORIF TIBIA PLATEAU Left 07/09/2014   Procedure: OPEN REDUCTION INTERNAL FIXATION (ORIF) TIBIAL PLATEAU;  Surgeon: Sheral Apley, MD;  Location: [facility];  Service: Orthopedics;  Laterality: Left;   TONSILLECTOMY     age 36   UPPER GI ENDOSCOPY  09/10/2019   Social History   Social History  Narrative   Not on file    There is no immunization history on file for this patient.   Objective: Vital Signs: BP 130/85 (BP Location: Left Arm, Patient Position: Sitting, Cuff Size: Normal)   Pulse 76   Resp 16   Ht 5\' 2"  (1.575 m)   Wt 183 lb (83 kg)   BMI 33.47 kg/m    Physical Exam Skin:    Comments: Erythematous scaly rash was noted on the nape of her neck consistent with contact dermatitis.      Musculoskeletal Exam: Cervical, thoracic and lumbar spine were in good range of motion.  Shoulder joints, elbow joints, wrist joints, MCPs PIPs and DIPs with good range of motion with no synovitis.  She had tenderness across her PIP joints.  Hip joints and knee joints with good range of motion.  She had tenderness over bilateral trochanteric area and over the medial aspect of her knee joints.  No warmth swelling or effusion was noted.  There was no tenderness over ankles or MTPs.  She had calluses on her palms and her plantar surface.  Arthritic changes were noted in the bilateral first MTPs.  CDAI Exam: CDAI Score: -- Patient Global: --; Provider Global: -- Swollen: --; Tender: -- Joint Exam 05/29/2022   No joint exam has been documented for this visit   There is currently no information documented on the homunculus. Go to the Rheumatology activity and complete the homunculus joint exam.  Investigation: No additional findings.  Imaging: No results found.  Recent Labs: Lab Results  Component Value Date   WBC 17.3 (H) 07/27/2021   HGB 15.0 07/27/2021   PLT 374 07/27/2021   NA 135 07/27/2021   K 3.5 07/27/2021   CL 103 07/27/2021   CO2 21 (L) 07/27/2021   GLUCOSE 102 (H) 07/27/2021   BUN 8 07/27/2021   CREATININE 0.73 07/27/2021   BILITOT 0.8 07/27/2021   ALKPHOS 84 07/27/2021   AST 27 07/27/2021   ALT 28 07/27/2021   PROT 7.7 07/27/2021   ALBUMIN 4.2 07/27/2021   CALCIUM 9.1 07/27/2021   GFRAA >60 07/25/2018    Speciality Comments: No specialty comments  available.  Procedures:  No procedures performed Allergies: Ibuprofen, Tamiflu [oseltamivir phosphate], Dexamethasone, Flagyl [metronidazole], Latex, Nickel, Oxycodone, and Wellbutrin [bupropion]   Assessment / Plan:     Visit Diagnoses: Positive ANA (antinuclear antibody) - ANA 1:40NS on 04/05/21.  Repeat ANA negative on 04/11/2021.  ANA 1:80 NS on March 06, 2022 .  Besides positive RNP all other autoimmune antibodies have been negative.  Complements have been normal.  There is no history of oral ulcers, nasal ulcers, malar rash, photosensitivity, inflammatory arthritis or lymphadenopathy.  She had good capillary refill without any nailbed capillary changes or sclerodactyly.   I cannot make a diagnosis of autoimmune disease at this point.  I will refer her to [facility] rheumatology.  RNP antibody - RNP remains positive.  Values were discussed with the patient.  There is no history of oral ulcers,  nasal ulcers, malar rash, photosensitivity or lymphadenopathy.  Visual disturbance -she continues to have visual disturbances.  According to the patient she has been evaluated by several ophthalmologist in the past.  She was evaluated by neuro-ophthalmologist that week in June 2023.  She was referred to a retina specialist.  She was also recently evaluated by Dr. Everlena Cooper.  Pain in both hands -she continues to have pain and discomfort in the bilateral hands.  No synovitis was noted.  Anti-CCP negative on 07/07/2021.  RF negative on 04/11/2021.  X-rays were unremarkable.  Paresthesia of both hands-she complains of paresthesias in her bilateral hands.  She was evaluated by Dr. Everlena Cooper on May 09, 2022.  I reviewed the records and the workup done by Dr. Everlena Cooper.  He did extensive workup.  She had vitamin B1 deficiency.  B6 level, immunoglobulins, IFE, TSH and ACE level were within normal limits.  Nerve conduction velocity results are pending.  She gives history of discoloration of her hands.  There is  no typical history of Raynaud's phenomenon.  She states that her fingers and toes turn red and painful.  Chronic pain of both knees -no warmth swelling or effusion was noted.  X-rays were unremarkable.  Left knee arthroscopic surgery in the past.  Contact dermatitis, unspecified contact dermatitis type, unspecified trigger -patient was evaluated by a dermatologist in [place] for the rash on her scalp and neck.  She was diagnosed with contact dermatitis related to the hair color.  Most of her contact dermatitis has resolved.  She had residual dermatitis on her neck.    Fibromyalgia-she has generalized pain and discomfort.  She had positive tender points and generalized hyperalgesia.  I will refer her to integrative therapies for further evaluation and treatment.  Other fatigue-most likely related to fibromyalgia.  Primary insomnia-good sleep hygiene was discussed.  Irritable bowel syndrome with both constipation and diarrhea  Bipolar 1 disorder, depressed, mild (HCC)  History of gastroesophageal reflux (GERD)  Family history of psoriasis in mother  Family history of Crohn's disease-paternal first cousin  Orders: No orders of the defined types were placed in this encounter.  No orders of the defined types were placed in this encounter.    Follow-Up Instructions: Return if symptoms worsen or fail to improve, for +ANA, +RNP.   Pollyann Savoy, MD  Note - This record has been created using Animal nutritionist.  Chart creation errors have been sought, but may not always  have been located. Such creation errors do not reflect on  the standard of medical care.

## 2022-05-16 LAB — VITAMIN B1: Vitamin B1 (Thiamine): 7 nmol/L — ABNORMAL LOW (ref 8–30)

## 2022-05-16 LAB — ANGIOTENSIN CONVERTING ENZYME: Angiotensin-Converting Enzyme: 22 U/L (ref 9–67)

## 2022-05-16 LAB — VITAMIN B6: Vitamin B6: 5.5 ng/mL (ref 2.1–21.7)

## 2022-05-18 ENCOUNTER — Telehealth: Payer: Self-pay

## 2022-05-18 LAB — IMMUNOFIXATION, SERUM
IgA/Immunoglobulin A, Serum: 311 mg/dL (ref 87–352)
IgG (Immunoglobin G), Serum: 1341 mg/dL (ref 586–1602)
IgM (Immunoglobulin M), Srm: 134 mg/dL (ref 26–217)

## 2022-05-18 NOTE — Telephone Encounter (Signed)
-----   Message from Drema Dallas, DO sent at 05/18/2022  7:12 AM EST ----- Labs for non-autoimmune causes of neuropathy are unremarkable.  The vitamin B1 (thiamine) level is borderline low but not enough to suspect it is the cause of the numbness and tingling.  She should eat foods rich with thiamine such as enriched/fortified cereals and breads, green peas (and other green vegetables, beans/lentils, seeds, and (unless vegetarian/vegan) meats (beef, pork, fish).

## 2022-05-18 NOTE — Telephone Encounter (Signed)
Pt called an informed Labs for non-autoimmune causes of neuropathy are unremarkable.  The vitamin B1 (thiamine) level is borderline low but not enough to suspect it is the cause of the numbness and tingling.  She should eat foods rich with thiamine such as enriched/fortified cereals and breads, green peas (and other green vegetables, beans/lentils, seeds, and (unless vegetarian/vegan) meats (beef, pork, fish)

## 2022-05-29 ENCOUNTER — Encounter: Payer: Self-pay | Admitting: Rheumatology

## 2022-05-29 ENCOUNTER — Ambulatory Visit: Payer: No Typology Code available for payment source | Attending: Rheumatology | Admitting: Rheumatology

## 2022-05-29 ENCOUNTER — Encounter: Payer: Self-pay | Admitting: Neurology

## 2022-05-29 VITALS — BP 130/85 | HR 76 | Resp 16 | Ht 62.0 in | Wt 183.0 lb

## 2022-05-29 DIAGNOSIS — H539 Unspecified visual disturbance: Secondary | ICD-10-CM

## 2022-05-29 DIAGNOSIS — M791 Myalgia, unspecified site: Secondary | ICD-10-CM

## 2022-05-29 DIAGNOSIS — R9389 Abnormal findings on diagnostic imaging of other specified body structures: Secondary | ICD-10-CM

## 2022-05-29 DIAGNOSIS — R768 Other specified abnormal immunological findings in serum: Secondary | ICD-10-CM | POA: Diagnosis not present

## 2022-05-29 DIAGNOSIS — M79642 Pain in left hand: Secondary | ICD-10-CM

## 2022-05-29 DIAGNOSIS — M79641 Pain in right hand: Secondary | ICD-10-CM | POA: Diagnosis not present

## 2022-05-29 DIAGNOSIS — Z8379 Family history of other diseases of the digestive system: Secondary | ICD-10-CM

## 2022-05-29 DIAGNOSIS — R21 Rash and other nonspecific skin eruption: Secondary | ICD-10-CM | POA: Diagnosis not present

## 2022-05-29 DIAGNOSIS — M25561 Pain in right knee: Secondary | ICD-10-CM

## 2022-05-29 DIAGNOSIS — Z8719 Personal history of other diseases of the digestive system: Secondary | ICD-10-CM

## 2022-05-29 DIAGNOSIS — L659 Nonscarring hair loss, unspecified: Secondary | ICD-10-CM

## 2022-05-29 DIAGNOSIS — Z840 Family history of diseases of the skin and subcutaneous tissue: Secondary | ICD-10-CM

## 2022-05-29 DIAGNOSIS — F3131 Bipolar disorder, current episode depressed, mild: Secondary | ICD-10-CM

## 2022-05-29 DIAGNOSIS — R5383 Other fatigue: Secondary | ICD-10-CM

## 2022-05-29 DIAGNOSIS — G8929 Other chronic pain: Secondary | ICD-10-CM

## 2022-05-29 DIAGNOSIS — R202 Paresthesia of skin: Secondary | ICD-10-CM

## 2022-05-29 DIAGNOSIS — L259 Unspecified contact dermatitis, unspecified cause: Secondary | ICD-10-CM

## 2022-05-29 DIAGNOSIS — M25562 Pain in left knee: Secondary | ICD-10-CM

## 2022-05-29 DIAGNOSIS — M797 Fibromyalgia: Secondary | ICD-10-CM

## 2022-05-29 DIAGNOSIS — K582 Mixed irritable bowel syndrome: Secondary | ICD-10-CM | POA: Insufficient documentation

## 2022-05-29 DIAGNOSIS — F5101 Primary insomnia: Secondary | ICD-10-CM

## 2022-06-05 ENCOUNTER — Other Ambulatory Visit: Payer: Self-pay

## 2022-06-05 DIAGNOSIS — R202 Paresthesia of skin: Secondary | ICD-10-CM

## 2022-06-05 DIAGNOSIS — R768 Other specified abnormal immunological findings in serum: Secondary | ICD-10-CM

## 2022-06-05 DIAGNOSIS — M791 Myalgia, unspecified site: Secondary | ICD-10-CM

## 2022-06-05 NOTE — Progress Notes (Signed)
Per Dr.Jaffe,  Please send referral to [facility] Rheumatology and let patient know when we have sent the referral.     Reason for referral - positive ANA, myalgias, arthralgias, paresthesias

## 2022-06-14 ENCOUNTER — Ambulatory Visit: Payer: No Typology Code available for payment source | Admitting: Neurology

## 2022-06-14 DIAGNOSIS — R200 Anesthesia of skin: Secondary | ICD-10-CM

## 2022-06-14 DIAGNOSIS — R202 Paresthesia of skin: Secondary | ICD-10-CM | POA: Diagnosis not present

## 2022-06-14 NOTE — Procedures (Signed)
[facility]  [place], [address]  [place], [place] [address] Tel: [phone] Fax: [phone] Test Date:  06/14/2022  Patient: Vanessa Nelson DOB: September 14, 1995 Physician: Narda Amber, DO  Sex: Female Height: 5\' 2"  Ref Phys: Metta Clines, DO  ID#: [id number]   Technician:    History: This is a 27 year old female referred for evaluation of generalized paresthesias of the arms and legs.  NCV & EMG Findings: Extensive electrodiagnostic testing of the right upper and lower extremity shows:  Right mixed palmar sensory responses show prolonged latency.  Right median, ulnar, sural, and superficial peroneal nerves are within normal limits. Right median, ulnar, peroneal, and tibial motor responses are within normal limits. Right tibial H reflex study is within normal limits. There is no evidence of active or chronic motor axonal loss changes affecting any of the tested muscles.  Motor unit configuration and recruitment pattern is within normal limits.  Impression: Right median neuropathy at or distal to the wrist, consistent with a clinical diagnosis of carpal tunnel syndrome.  Overall, these findings are very mild in degree electrically. There is no evidence of a large fiber sensorimotor polyneuropathy or cervical/lumbosacral radiculopathy affecting the right upper and lower extremities.   ___________________________ Narda Amber, DO    Nerve Conduction Studies   Stim Site NR Peak (ms) Norm Peak (ms) O-P Amp (V) Norm O-P Amp  Right Median Anti Sensory (2nd Digit)  32 C  Wrist    3.2 <3.3 45.8 >20  Right Sup Peroneal Anti Sensory (Ant Lat Mall)  32 C  12 cm    1.9 <4.4 38.5 >6  Right Sural Anti Sensory (Lat Mall)  32 C  Calf    2.4 <4.4 54.4 >6  Right Ulnar Anti Sensory (5th Digit)  32 C  Wrist    2.4 <3.0 42.7 >18     Stim Site NR Onset (ms) Norm Onset (ms) O-P Amp (mV) Norm O-P Amp Site1 Site2 Delta-0 (ms) Dist (cm) Vel (m/s) Norm Vel (m/s)  Right Median  Motor (Abd Poll Brev)  32 C  Wrist    3.6 <3.9 11.4 >6 Elbow Wrist 3.9 26.0 67 >51  Elbow    7.5  11.0         Right Peroneal Motor (Ext Dig Brev)  32 C  Ankle    2.3 <5.5 7.7 >3 B Fib Ankle 6.4 37.0 58 >41  B Fib    8.7  7.2  Poplt B Fib 1.4 8.0 57 >41  Poplt    10.1  7.1         Right Tibial Motor (Abd Hall Brev)  32 C  Ankle    2.7 <5.8 19.4 >8 Knee Ankle 7.5 41.0 55 >41  Knee    10.2  14.9         Right Ulnar Motor (Abd Dig Minimi)  32 C  Wrist    2.0 <3.0 15.5 >8 B Elbow Wrist 3.2 21.0 66 >51  B Elbow    5.2  14.8  A Elbow B Elbow 1.1 8.0 73 >51  A Elbow    6.3  14.6            Stim Site NR Peak (ms) Norm Peak (ms) P-T Amp (V) Site1 Site2 Delta-P (ms) Norm Delta (ms)  Right Median/Ulnar Palm Comparison (Wrist - 8cm)  32 C  Median Palm    2.1 <2.2 34.7 Median Palm Ulnar Palm *1.0   Ulnar Palm    1.1 <2.2 10.4  Electromyography   Side Muscle Ins.Act Fibs Fasc Recrt Amp Dur Poly Activation Comment  Right 1stDorInt Nml Nml Nml Nml Nml Nml Nml Nml N/A  Right Abd Poll Brev Nml Nml Nml Nml Nml Nml Nml Nml N/A  Right PronatorTeres Nml Nml Nml Nml Nml Nml Nml Nml N/A  Right Biceps Nml Nml Nml Nml Nml Nml Nml Nml N/A  Right Triceps Nml Nml Nml Nml Nml Nml Nml Nml N/A  Right Deltoid Nml Nml Nml Nml Nml Nml Nml Nml N/A  Right AntTibialis Nml Nml Nml Nml Nml Nml Nml Nml N/A  Right Gastroc Nml Nml Nml Nml Nml Nml Nml Nml N/A  Right Flex Dig Long Nml Nml Nml Nml Nml Nml Nml Nml N/A  Right RectFemoris Nml Nml Nml Nml Nml Nml Nml Nml N/A  Right GluteusMed Nml Nml Nml Nml Nml Nml Nml Nml N/A      Waveforms:

## 2022-06-15 ENCOUNTER — Telehealth: Payer: Self-pay

## 2022-06-15 NOTE — Telephone Encounter (Signed)
-----  Message from Pieter Partridge, DO sent at 06/15/2022  6:58 AM EST ----- Nerve study shows evidence of a very mild carpal tunnel syndrome in the right hand, which wouldn't explain all of the symptoms.  As discussed, I would like to proceed with a punch skin biopsy with Dr. Berdine Addison to evaluate for small fiber neuropathy.  Because she mentioned that the numbness and tingling is worse in the right hand, she can try buying a wrist splint (can get at any pharmacy) and wearing it at night to see if some of the symptoms in the right hand improve.

## 2022-06-15 NOTE — Progress Notes (Signed)
Tried calling patient no answer. LMOVM to call the office back.

## 2022-06-15 NOTE — Telephone Encounter (Signed)
Patient advised of Results.   Front desk please call patient to schedule for Skin biopsy.

## 2022-06-18 NOTE — Telephone Encounter (Signed)
LMOM to get patient sch for the Skin Biopsy  with hill

## 2022-06-26 ENCOUNTER — Ambulatory Visit (INDEPENDENT_AMBULATORY_CARE_PROVIDER_SITE_OTHER): Payer: No Typology Code available for payment source | Admitting: Neurology

## 2022-06-26 DIAGNOSIS — R202 Paresthesia of skin: Secondary | ICD-10-CM

## 2022-06-26 DIAGNOSIS — R200 Anesthesia of skin: Secondary | ICD-10-CM

## 2022-06-26 NOTE — Progress Notes (Signed)
Punch Biopsy Procedure Note  Preprocedure Diagnosis: disturbance of skin sensation   Postprocedure Diagnosis: same  Locations: Site 1: right lateral distal leg;  Site 2: right lateral thigh;   Indications: r/o small fiber neuropathy  Anesthesia: 5 mL Lidocaine 1% with epinephrine  Procedure Details Patient informed of the risks (including but not limited to bleeding, pain, infection, scar and infection) and benefits of the procedure.  Informed consent obtained.  The areas which were chosen for biopsy, as above, and surrounding areas were given a sterile prep using alcohol and iodine. The skin was then stretched perpendicular to the skin tension lines and sample removed using the 3 mm punch. Pressure applied, hemostasis achieved.   Dressing applied. The specimen(s) was sent for pathologic examination. The patient tolerated the procedure well.  Estimated Blood Loss: 2 ml  Condition: Stable  Complications: none.  Plan: 1. Instructed to keep the wound dry and covered for 24h and clean thereafter. 2. Warning signs of infection were reviewed.    Kai Levins, MD [facility]

## 2022-07-05 NOTE — Telephone Encounter (Signed)
Patient advised of Dr.Jaffe note below.  

## 2022-07-05 NOTE — Telephone Encounter (Signed)
Please let patient know:  Skin biopsy is negative for neuropathy.  Labs for specific causes of numbness and tingling are unremarkable.  The nerve conduction study was negative for neuropathy.  Unfortunately, I do not have a neurologic explanation for the numbness and tingling.

## 2022-07-24 ENCOUNTER — Encounter: Payer: Self-pay | Admitting: Neurology

## 2022-08-09 ENCOUNTER — Other Ambulatory Visit: Payer: Self-pay | Admitting: *Deleted

## 2022-08-09 ENCOUNTER — Telehealth: Payer: Self-pay | Admitting: Rheumatology

## 2022-08-09 ENCOUNTER — Telehealth: Payer: Self-pay

## 2022-08-09 DIAGNOSIS — R5383 Other fatigue: Secondary | ICD-10-CM

## 2022-08-09 DIAGNOSIS — R21 Rash and other nonspecific skin eruption: Secondary | ICD-10-CM

## 2022-08-09 DIAGNOSIS — M79642 Pain in left hand: Secondary | ICD-10-CM

## 2022-08-09 DIAGNOSIS — M797 Fibromyalgia: Secondary | ICD-10-CM

## 2022-08-09 DIAGNOSIS — L259 Unspecified contact dermatitis, unspecified cause: Secondary | ICD-10-CM

## 2022-08-09 DIAGNOSIS — M2550 Pain in unspecified joint: Secondary | ICD-10-CM

## 2022-08-09 DIAGNOSIS — R7689 Other specified abnormal immunological findings in serum: Secondary | ICD-10-CM

## 2022-08-09 DIAGNOSIS — M791 Myalgia, unspecified site: Secondary | ICD-10-CM

## 2022-08-09 DIAGNOSIS — G8929 Other chronic pain: Secondary | ICD-10-CM

## 2022-08-09 DIAGNOSIS — R768 Other specified abnormal immunological findings in serum: Secondary | ICD-10-CM

## 2022-08-09 DIAGNOSIS — R202 Paresthesia of skin: Secondary | ICD-10-CM

## 2022-08-09 DIAGNOSIS — M79641 Pain in right hand: Secondary | ICD-10-CM

## 2022-08-09 NOTE — Telephone Encounter (Signed)
Letter received from [facility], Unable to accept Referral right now due to a high volume of referral.   Referral was reviewed by a provider.   Advised patient of letter. Advised to try an find one that will take new patient and we will send the referral.

## 2022-08-09 NOTE — Telephone Encounter (Signed)
I called patient, per patient's request referral placed for [facility] and Dr. Jon Billings office, pending appt.

## 2022-08-09 NOTE — Telephone Encounter (Signed)
Patient called stating Dr. Estanislado Pandy referred her to a rheumatologist in [place] [facility] without asking her and [place] is too far due to her blindness.  Patient states [facility] denied her referral due to receiving too many referrals. Patient states she doesn't understand why Dr. Estanislado Pandy will not treat her anymore and is very frustrated.  Patient requested to speak with the office manager and asked that she return her call as soon as possible.

## 2022-08-23 ENCOUNTER — Encounter: Payer: Self-pay | Admitting: Neurology

## 2022-12-03 ENCOUNTER — Encounter: Payer: Self-pay | Admitting: Neurology
